# Patient Record
Sex: Female | Born: 1955 | Race: White | Hispanic: No | Marital: Single | Smoking: Former smoker
Health system: Southern US, Community
[De-identification: ages and names within clinical notes are randomized; demographics above are authoritative.]

---

## 2004-11-28 ENCOUNTER — Ambulatory Visit: Payer: Self-pay | Admitting: *Deleted

## 2004-12-19 ENCOUNTER — Ambulatory Visit: Payer: Self-pay | Admitting: *Deleted

## 2005-03-13 ENCOUNTER — Other Ambulatory Visit: Payer: Self-pay

## 2005-03-13 ENCOUNTER — Inpatient Hospital Stay: Payer: Self-pay | Admitting: Psychiatry

## 2005-04-02 ENCOUNTER — Other Ambulatory Visit: Payer: Self-pay

## 2005-04-28 ENCOUNTER — Emergency Department: Payer: Self-pay | Admitting: Emergency Medicine

## 2005-06-06 ENCOUNTER — Emergency Department: Payer: Self-pay | Admitting: Emergency Medicine

## 2005-12-25 ENCOUNTER — Ambulatory Visit: Payer: Self-pay | Admitting: *Deleted

## 2006-01-20 ENCOUNTER — Emergency Department: Payer: Self-pay | Admitting: Emergency Medicine

## 2006-01-21 ENCOUNTER — Inpatient Hospital Stay: Payer: Self-pay | Admitting: Unknown Physician Specialty

## 2007-03-05 ENCOUNTER — Emergency Department: Payer: Self-pay | Admitting: Emergency Medicine

## 2007-03-05 ENCOUNTER — Other Ambulatory Visit: Payer: Self-pay

## 2007-03-06 ENCOUNTER — Ambulatory Visit: Payer: Self-pay | Admitting: *Deleted

## 2007-03-13 ENCOUNTER — Ambulatory Visit: Payer: Self-pay | Admitting: Emergency Medicine

## 2007-07-07 ENCOUNTER — Emergency Department: Payer: Self-pay | Admitting: Emergency Medicine

## 2007-07-07 ENCOUNTER — Other Ambulatory Visit: Payer: Self-pay

## 2007-11-12 ENCOUNTER — Inpatient Hospital Stay: Payer: Self-pay | Admitting: Psychiatry

## 2008-07-18 ENCOUNTER — Emergency Department: Payer: Self-pay | Admitting: Emergency Medicine

## 2008-09-11 ENCOUNTER — Inpatient Hospital Stay: Payer: Self-pay | Admitting: Psychiatry

## 2009-03-28 ENCOUNTER — Encounter: Admission: RE | Admit: 2009-03-28 | Discharge: 2009-03-28 | Payer: Self-pay | Admitting: Neurology

## 2009-04-08 ENCOUNTER — Ambulatory Visit: Payer: Self-pay | Admitting: Family Medicine

## 2009-06-06 ENCOUNTER — Ambulatory Visit: Payer: Self-pay | Admitting: Psychology

## 2009-07-12 ENCOUNTER — Ambulatory Visit: Payer: Self-pay | Admitting: Internal Medicine

## 2010-04-03 ENCOUNTER — Emergency Department: Payer: Self-pay | Admitting: Emergency Medicine

## 2010-05-04 ENCOUNTER — Inpatient Hospital Stay: Payer: Self-pay | Admitting: Psychiatry

## 2010-05-26 DIAGNOSIS — F200 Paranoid schizophrenia: Secondary | ICD-10-CM

## 2010-06-20 ENCOUNTER — Ambulatory Visit: Payer: Self-pay | Admitting: Internal Medicine

## 2010-07-06 ENCOUNTER — Inpatient Hospital Stay: Payer: Self-pay | Admitting: Psychiatry

## 2010-07-17 ENCOUNTER — Ambulatory Visit: Payer: Self-pay | Admitting: Internal Medicine

## 2010-08-19 ENCOUNTER — Inpatient Hospital Stay: Payer: Self-pay | Admitting: Internal Medicine

## 2011-02-07 ENCOUNTER — Observation Stay: Payer: Self-pay | Admitting: Internal Medicine

## 2011-02-07 LAB — URINALYSIS, COMPLETE
Bacteria: NONE SEEN
Bilirubin,UR: NEGATIVE
Blood: NEGATIVE
Glucose,UR: NEGATIVE mg/dL (ref 0–75)
Ketone: NEGATIVE
Ph: 6 (ref 4.5–8.0)
Protein: NEGATIVE
RBC,UR: <1 /HPF (ref 0–5)

## 2011-02-07 LAB — COMPREHENSIVE METABOLIC PANEL
Albumin: 3.6 g/dL (ref 3.4–5.0)
Alkaline Phosphatase: 117 U/L (ref 50–136)
Anion Gap: 10 (ref 7–16)
BUN: 9 mg/dL (ref 7–18)
Bilirubin,Total: 0.2 mg/dL (ref 0.2–1.0)
Calcium, Total: 9.3 mg/dL (ref 8.5–10.1)
Creatinine: 0.84 mg/dL (ref 0.60–1.30)
Glucose: 124 mg/dL — ABNORMAL HIGH (ref 65–99)
Osmolality: 266 (ref 275–301)
SGOT(AST): 13 U/L — ABNORMAL LOW (ref 15–37)

## 2011-02-07 LAB — DRUG SCREEN, URINE
Amphetamines, Ur Screen: NEGATIVE (ref ?–1000)
Benzodiazepine, Ur Scrn: NEGATIVE (ref ?–200)
MDMA (Ecstasy)Ur Screen: NEGATIVE (ref ?–500)
Methadone, Ur Screen: NEGATIVE (ref ?–300)
Phencyclidine (PCP) Ur S: NEGATIVE (ref ?–25)
Tricyclic, Ur Screen: NEGATIVE (ref ?–1000)

## 2011-02-07 LAB — AMMONIA: Ammonia, Plasma: <25 mcmol/L (ref 11–32)

## 2011-02-07 LAB — CBC
HGB: 10.3 g/dL — ABNORMAL LOW (ref 12.0–16.0)
MCH: 32.5 pg (ref 26.0–34.0)
MCHC: 34.1 g/dL (ref 32.0–36.0)
MCV: 95 fL (ref 80–100)
Platelet: 241 10*3/uL (ref 150–440)
RBC: 3.17 10*6/uL — ABNORMAL LOW (ref 3.80–5.20)
RDW: 13.9 % (ref 11.5–14.5)

## 2011-02-07 LAB — ETHANOL
Ethanol %: <0.003 % (ref 0.000–0.080)
Ethanol: <3 mg/dL

## 2011-02-08 LAB — BASIC METABOLIC PANEL
Anion Gap: 9 (ref 7–16)
BUN: 6 mg/dL — ABNORMAL LOW (ref 7–18)
Calcium, Total: 8.7 mg/dL (ref 8.5–10.1)
Chloride: 102 mmol/L (ref 98–107)
Co2: 26 mmol/L (ref 21–32)
Creatinine: 0.76 mg/dL (ref 0.60–1.30)
EGFR (Non-African Amer.): >60
Glucose: 131 mg/dL — ABNORMAL HIGH (ref 65–99)
Potassium: 3.9 mmol/L (ref 3.5–5.1)
Sodium: 137 mmol/L (ref 136–145)

## 2011-02-15 ENCOUNTER — Ambulatory Visit: Payer: Self-pay | Admitting: Cardiothoracic Surgery

## 2011-02-20 ENCOUNTER — Ambulatory Visit: Payer: Self-pay | Admitting: Cardiothoracic Surgery

## 2011-02-21 LAB — CREATININE, SERUM: Creatinine: 0.76 mg/dL (ref 0.60–1.30)

## 2011-02-22 LAB — CBC CANCER CENTER
Basophil #: 0 x10 3/mm (ref 0.0–0.1)
Eosinophil #: 0.6 x10 3/mm (ref 0.0–0.7)
MCHC: 34.3 g/dL (ref 32.0–36.0)
MCV: 95 fL (ref 80–100)
Monocyte %: 9.3 %
Neutrophil #: 5.7 x10 3/mm (ref 1.4–6.5)
Neutrophil %: 73.7 %
Platelet: 384 x10 3/mm (ref 150–440)
RDW: 13.9 % (ref 11.5–14.5)
WBC: 7.8 x10 3/mm (ref 3.6–11.0)

## 2011-02-22 LAB — COMPREHENSIVE METABOLIC PANEL
Albumin: 3.8 g/dL (ref 3.4–5.0)
Bilirubin,Total: 0.2 mg/dL (ref 0.2–1.0)
Calcium, Total: 9.8 mg/dL (ref 8.5–10.1)
Chloride: 97 mmol/L — ABNORMAL LOW (ref 98–107)
Creatinine: 0.98 mg/dL (ref 0.60–1.30)
Glucose: 101 mg/dL — ABNORMAL HIGH (ref 65–99)
Osmolality: 267 (ref 275–301)
Potassium: 4.2 mmol/L (ref 3.5–5.1)
Sodium: 133 mmol/L — ABNORMAL LOW (ref 136–145)
Total Protein: 7.1 g/dL (ref 6.4–8.2)

## 2011-02-22 LAB — PROTIME-INR
INR: 0.9
Prothrombin Time: 12 secs (ref 11.5–14.7)

## 2011-02-26 ENCOUNTER — Ambulatory Visit: Payer: Self-pay | Admitting: Oncology

## 2011-02-26 LAB — APTT: Activated PTT: 34.3 secs (ref 23.6–35.9)

## 2011-02-28 LAB — PATHOLOGY REPORT

## 2011-03-09 ENCOUNTER — Ambulatory Visit: Payer: Self-pay | Admitting: Cardiothoracic Surgery

## 2011-03-12 LAB — CBC CANCER CENTER
Basophil #: 0.1 x10 3/mm (ref 0.0–0.1)
Eosinophil #: 0.6 x10 3/mm (ref 0.0–0.7)
Eosinophil %: 6.3 %
HCT: 31.5 % — ABNORMAL LOW (ref 35.0–47.0)
Lymphocyte #: 0.6 x10 3/mm — ABNORMAL LOW (ref 1.0–3.6)
MCH: 32.3 pg (ref 26.0–34.0)
MCV: 93 fL (ref 80–100)
Monocyte %: 7.9 %
Neutrophil %: 78 %
Platelet: 427 x10 3/mm (ref 150–440)
RDW: 13.4 % (ref 11.5–14.5)
WBC: 9.2 x10 3/mm (ref 3.6–11.0)

## 2011-03-12 LAB — BASIC METABOLIC PANEL
Anion Gap: 8 (ref 7–16)
Chloride: 99 mmol/L (ref 98–107)
Co2: 31 mmol/L (ref 21–32)
Creatinine: 0.83 mg/dL (ref 0.60–1.30)
Osmolality: 284 (ref 275–301)

## 2011-03-19 ENCOUNTER — Ambulatory Visit: Payer: Self-pay | Admitting: Emergency Medicine

## 2011-03-19 LAB — CBC CANCER CENTER
Eosinophil %: 2.5 %
HCT: 29.9 % — ABNORMAL LOW (ref 35.0–47.0)
Lymphocyte #: 0.3 x10 3/mm — ABNORMAL LOW (ref 1.0–3.6)
MCH: 32.1 pg (ref 26.0–34.0)
MCV: 93 fL (ref 80–100)
Monocyte #: 0.1 x10 3/mm (ref 0.0–0.7)
Monocyte %: 1.1 %
Neutrophil %: 90.7 %
Platelet: 249 x10 3/mm (ref 150–440)
RBC: 3.22 10*6/uL — ABNORMAL LOW (ref 3.80–5.20)
RDW: 13.3 % (ref 11.5–14.5)

## 2011-03-20 ENCOUNTER — Ambulatory Visit: Payer: Self-pay | Admitting: Emergency Medicine

## 2011-03-20 LAB — PROTIME-INR
INR: 0.8
Prothrombin Time: 11.7 secs (ref 11.5–14.7)

## 2011-03-22 ENCOUNTER — Ambulatory Visit: Payer: Self-pay | Admitting: Emergency Medicine

## 2011-04-02 LAB — CBC CANCER CENTER
Basophil %: 0.1 %
Eosinophil #: 0 x10 3/mm (ref 0.0–0.7)
HCT: 30.8 % — ABNORMAL LOW (ref 35.0–47.0)
MCH: 32 pg (ref 26.0–34.0)
MCHC: 34.1 g/dL (ref 32.0–36.0)
MCV: 94 fL (ref 80–100)
Monocyte #: 0.7 x10 3/mm (ref 0.0–0.7)
Monocyte %: 7 %
Neutrophil %: 89.5 %
Platelet: 498 x10 3/mm — ABNORMAL HIGH (ref 150–440)
RDW: 13.6 % (ref 11.5–14.5)
WBC: 9.9 x10 3/mm (ref 3.6–11.0)

## 2011-04-06 ENCOUNTER — Ambulatory Visit: Payer: Self-pay | Admitting: Cardiothoracic Surgery

## 2011-04-09 LAB — CBC CANCER CENTER
Basophil #: 0.1 x10 3/mm (ref 0.0–0.1)
Basophil %: 1 %
Eosinophil #: 0 x10 3/mm (ref 0.0–0.7)
HCT: 28.1 % — ABNORMAL LOW (ref 35.0–47.0)
HGB: 9.6 g/dL — ABNORMAL LOW (ref 12.0–16.0)
Lymphocyte #: 0.3 x10 3/mm — ABNORMAL LOW (ref 1.0–3.6)
Lymphocyte %: 2.4 %
MCH: 32.6 pg (ref 26.0–34.0)
MCHC: 34.3 g/dL (ref 32.0–36.0)
MCV: 95 fL (ref 80–100)
Monocyte #: 0.5 x10 3/mm (ref 0.0–0.7)
Monocyte %: 5 %
Neutrophil #: 9.6 x10 3/mm — ABNORMAL HIGH (ref 1.4–6.5)
RDW: 16.1 % — ABNORMAL HIGH (ref 11.5–14.5)

## 2011-04-09 LAB — COMPREHENSIVE METABOLIC PANEL
Albumin: 3.2 g/dL — ABNORMAL LOW (ref 3.4–5.0)
Alkaline Phosphatase: 122 U/L (ref 50–136)
Anion Gap: 13 (ref 7–16)
Bilirubin,Total: 0.2 mg/dL (ref 0.2–1.0)
Calcium, Total: 9.6 mg/dL (ref 8.5–10.1)
Co2: 25 mmol/L (ref 21–32)
Creatinine: 0.97 mg/dL (ref 0.60–1.30)
EGFR (African American): >60
Osmolality: 290 (ref 275–301)

## 2011-04-11 LAB — GLUCOSE, RANDOM: Glucose: 355 mg/dL — ABNORMAL HIGH (ref 65–99)

## 2011-04-16 LAB — CBC CANCER CENTER
Basophil #: 0 x10 3/mm (ref 0.0–0.1)
Basophil %: 0.5 %
Eosinophil #: 0 x10 3/mm (ref 0.0–0.7)
Eosinophil %: 0.5 %
HCT: 26.4 % — ABNORMAL LOW (ref 35.0–47.0)
HGB: 9.2 g/dL — ABNORMAL LOW (ref 12.0–16.0)
Lymphocyte #: 0.3 x10 3/mm — ABNORMAL LOW (ref 1.0–3.6)
Lymphocyte %: 5.7 %
MCH: 32.9 pg (ref 26.0–34.0)
MCHC: 34.7 g/dL (ref 32.0–36.0)
MCV: 95 fL (ref 80–100)
Monocyte #: 0 x10 3/mm (ref 0.0–0.7)
Monocyte %: 0.6 %
Neutrophil #: 5 x10 3/mm (ref 1.4–6.5)
Neutrophil %: 92.7 %
Platelet: 215 x10 3/mm (ref 150–440)
RBC: 2.78 10*6/uL — ABNORMAL LOW (ref 3.80–5.20)
RDW: 16.8 % — ABNORMAL HIGH (ref 11.5–14.5)
WBC: 5.4 x10 3/mm (ref 3.6–11.0)

## 2011-04-23 LAB — CBC CANCER CENTER
Basophil %: 0.4 %
Eosinophil %: 5.8 %
HCT: 24.1 % — ABNORMAL LOW (ref 35.0–47.0)
HGB: 8.4 g/dL — ABNORMAL LOW (ref 12.0–16.0)
Lymphocyte #: 0.4 x10 3/mm — ABNORMAL LOW (ref 1.0–3.6)
MCH: 33.1 pg (ref 26.0–34.0)
Monocyte #: 0.4 x10 3/mm (ref 0.0–0.7)
Monocyte %: 40 %
Neutrophil #: 0.2 x10 3/mm — ABNORMAL LOW (ref 1.4–6.5)
Neutrophil %: 18.7 %
RBC: 2.55 10*6/uL — ABNORMAL LOW (ref 3.80–5.20)
WBC: 1.1 x10 3/mm — CL (ref 3.6–11.0)

## 2011-04-30 LAB — CBC CANCER CENTER
Basophil %: 0 %
Eosinophil #: 0.1 x10 3/mm (ref 0.0–0.7)
Eosinophil %: 0.7 %
HCT: 31.1 % — ABNORMAL LOW (ref 35.0–47.0)
Lymphocyte #: 0.7 x10 3/mm — ABNORMAL LOW (ref 1.0–3.6)
Monocyte #: 1 x10 3/mm — ABNORMAL HIGH (ref 0.0–0.7)
Monocyte %: 8.7 %
Neutrophil %: 84.7 %
Platelet: 416 x10 3/mm (ref 150–440)
WBC: 11 x10 3/mm (ref 3.6–11.0)

## 2011-05-07 ENCOUNTER — Ambulatory Visit: Payer: Self-pay | Admitting: Cardiothoracic Surgery

## 2011-05-07 LAB — COMPREHENSIVE METABOLIC PANEL
Alkaline Phosphatase: 95 U/L (ref 50–136)
Anion Gap: 12 (ref 7–16)
BUN: 15 mg/dL (ref 7–18)
Bilirubin,Total: 0.2 mg/dL (ref 0.2–1.0)
EGFR (African American): >60
Potassium: 4.2 mmol/L (ref 3.5–5.1)
SGOT(AST): 16 U/L (ref 15–37)

## 2011-05-07 LAB — CBC CANCER CENTER
Basophil %: 0.1 %
Eosinophil #: 0.1 x10 3/mm (ref 0.0–0.7)
Eosinophil %: 0.6 %
Lymphocyte %: 7.3 %
MCH: 31.8 pg (ref 26.0–34.0)
MCHC: 33.8 g/dL (ref 32.0–36.0)
MCV: 94 fL (ref 80–100)
Monocyte #: 1 x10 3/mm — ABNORMAL HIGH (ref 0.0–0.7)
Neutrophil #: 8.1 x10 3/mm — ABNORMAL HIGH (ref 1.4–6.5)
Neutrophil %: 82.1 %
Platelet: 388 x10 3/mm (ref 150–440)
RBC: 3.43 10*6/uL — ABNORMAL LOW (ref 3.80–5.20)
RDW: 21.4 % — ABNORMAL HIGH (ref 11.5–14.5)
WBC: 9.8 x10 3/mm (ref 3.6–11.0)

## 2011-05-14 LAB — CBC CANCER CENTER
Basophil #: 0 x10 3/mm (ref 0.0–0.1)
Basophil %: 0.6 %
Eosinophil #: 0 x10 3/mm (ref 0.0–0.7)
Eosinophil %: 0.4 %
HCT: 25.2 % — ABNORMAL LOW (ref 35.0–47.0)
HGB: 8.9 g/dL — ABNORMAL LOW (ref 12.0–16.0)
Lymphocyte %: 8.6 %
MCH: 32.8 pg (ref 26.0–34.0)
MCHC: 35.2 g/dL (ref 32.0–36.0)
MCV: 93 fL (ref 80–100)
Monocyte #: 0.1 x10 3/mm (ref 0.0–0.7)
Neutrophil #: 3.6 x10 3/mm (ref 1.4–6.5)
Neutrophil %: 86.9 %
Platelet: 246 x10 3/mm (ref 150–440)
RBC: 2.7 10*6/uL — ABNORMAL LOW (ref 3.80–5.20)
RDW: 20.6 % — ABNORMAL HIGH (ref 11.5–14.5)

## 2011-05-21 LAB — CBC CANCER CENTER
Eosinophil #: 0 x10 3/mm (ref 0.0–0.7)
Eosinophil %: 1.9 %
HCT: 26.3 % — ABNORMAL LOW (ref 35.0–47.0)
HGB: 8.8 g/dL — ABNORMAL LOW (ref 12.0–16.0)
Lymphocyte #: 0.3 x10 3/mm — ABNORMAL LOW (ref 1.0–3.6)
Lymphocyte %: 12.4 %
MCV: 97 fL (ref 80–100)
Monocyte #: 0.3 x10 3/mm (ref 0.2–0.9)
Monocyte %: 16.1 %
Neutrophil #: 1.4 x10 3/mm (ref 1.4–6.5)
Platelet: 190 x10 3/mm (ref 150–440)
RBC: 2.72 10*6/uL — ABNORMAL LOW (ref 3.80–5.20)
WBC: 2.1 x10 3/mm — ABNORMAL LOW (ref 3.6–11.0)

## 2011-05-24 ENCOUNTER — Ambulatory Visit: Payer: Self-pay | Admitting: Emergency Medicine

## 2011-05-24 LAB — CBC WITH DIFFERENTIAL/PLATELET
Basophil %: 1 %
Eosinophil #: 0 10*3/uL (ref 0.0–0.7)
Eosinophil %: 1.7 %
HGB: 9.7 g/dL — ABNORMAL LOW (ref 12.0–16.0)
Lymphocyte #: 0.3 10*3/uL — ABNORMAL LOW (ref 1.0–3.6)
MCH: 32.5 pg (ref 26.0–34.0)
Monocyte #: 0.7 x10 3/mm (ref 0.2–0.9)
Monocyte %: 36.4 %
Neutrophil #: 0.9 10*3/uL — ABNORMAL LOW (ref 1.4–6.5)
Neutrophil %: 47.3 %
Platelet: 287 10*3/uL (ref 150–440)
RDW: 21.2 % — ABNORMAL HIGH (ref 11.5–14.5)
WBC: 1.9 10*3/uL — CL (ref 3.6–11.0)

## 2011-05-24 LAB — COMPREHENSIVE METABOLIC PANEL
Albumin: 3.5 g/dL (ref 3.4–5.0)
Alkaline Phosphatase: 88 U/L (ref 50–136)
Anion Gap: 11 (ref 7–16)
Bilirubin,Total: 0.3 mg/dL (ref 0.2–1.0)
Co2: 26 mmol/L (ref 21–32)
EGFR (African American): >60
Osmolality: 273 (ref 275–301)
SGPT (ALT): 11 U/L — ABNORMAL LOW
Sodium: 134 mmol/L — ABNORMAL LOW (ref 136–145)
Total Protein: 6.7 g/dL (ref 6.4–8.2)

## 2011-05-28 LAB — CBC CANCER CENTER
Basophil #: 0 x10 3/mm (ref 0.0–0.1)
Basophil %: 0.6 %
Eosinophil %: 1.9 %
Lymphocyte #: 0.4 x10 3/mm — ABNORMAL LOW (ref 1.0–3.6)
Lymphocyte %: 8.6 %
MCHC: 33.3 g/dL (ref 32.0–36.0)
Monocyte %: 17.4 %
Neutrophil #: 3.6 x10 3/mm (ref 1.4–6.5)
Platelet: 285 x10 3/mm (ref 150–440)
RBC: 2.9 10*6/uL — ABNORMAL LOW (ref 3.80–5.20)
WBC: 5.1 x10 3/mm (ref 3.6–11.0)

## 2011-06-04 LAB — CBC CANCER CENTER
Basophil #: 0 x10 3/mm (ref 0.0–0.1)
Basophil %: 0.8 %
HCT: 27.7 % — ABNORMAL LOW (ref 35.0–47.0)
Lymphocyte %: 7.4 %
Monocyte #: 0.8 x10 3/mm (ref 0.2–0.9)
Monocyte %: 12.1 %
Neutrophil #: 4.9 x10 3/mm (ref 1.4–6.5)
RBC: 2.82 10*6/uL — ABNORMAL LOW (ref 3.80–5.20)
RDW: 20.8 % — ABNORMAL HIGH (ref 11.5–14.5)
WBC: 6.3 x10 3/mm (ref 3.6–11.0)

## 2011-06-06 ENCOUNTER — Ambulatory Visit: Payer: Self-pay | Admitting: Cardiothoracic Surgery

## 2011-06-11 LAB — COMPREHENSIVE METABOLIC PANEL
Alkaline Phosphatase: 75 U/L (ref 50–136)
Anion Gap: 8 (ref 7–16)
BUN: 13 mg/dL (ref 7–18)
Bilirubin,Total: 0.2 mg/dL (ref 0.2–1.0)
Chloride: 101 mmol/L (ref 98–107)
Co2: 28 mmol/L (ref 21–32)
EGFR (African American): >60
EGFR (Non-African Amer.): >60
Glucose: 167 mg/dL — ABNORMAL HIGH (ref 65–99)
Osmolality: 278 (ref 275–301)
Potassium: 4 mmol/L (ref 3.5–5.1)
SGOT(AST): 10 U/L — ABNORMAL LOW (ref 15–37)
SGPT (ALT): 9 U/L — ABNORMAL LOW
Sodium: 137 mmol/L (ref 136–145)
Total Protein: 6.2 g/dL — ABNORMAL LOW (ref 6.4–8.2)

## 2011-06-11 LAB — CBC CANCER CENTER
Basophil #: 0 x10 3/mm (ref 0.0–0.1)
Basophil %: 0.7 %
Eosinophil #: 0.1 x10 3/mm (ref 0.0–0.7)
Eosinophil %: 0.9 %
HGB: 8.9 g/dL — ABNORMAL LOW (ref 12.0–16.0)
Lymphocyte %: 6.3 %
MCH: 32.8 pg (ref 26.0–34.0)
MCHC: 33 g/dL (ref 32.0–36.0)
MCV: 100 fL (ref 80–100)
Monocyte %: 12 %
Platelet: 266 x10 3/mm (ref 150–440)
RDW: 20 % — ABNORMAL HIGH (ref 11.5–14.5)
WBC: 6.5 x10 3/mm (ref 3.6–11.0)

## 2011-06-12 LAB — GLUCOSE, RANDOM: Glucose: 145 mg/dL — ABNORMAL HIGH (ref 65–99)

## 2011-06-13 LAB — GLUCOSE, RANDOM: Glucose: 139 mg/dL — ABNORMAL HIGH (ref 65–99)

## 2011-06-18 LAB — CBC CANCER CENTER
Basophil #: 0.1 x10 3/mm (ref 0.0–0.1)
Basophil %: 1.3 %
Eosinophil #: 0.1 x10 3/mm (ref 0.0–0.7)
Lymphocyte #: 0.4 x10 3/mm — ABNORMAL LOW (ref 1.0–3.6)
Lymphocyte %: 7.5 %
MCHC: 32.9 g/dL (ref 32.0–36.0)
MCV: 100 fL (ref 80–100)
Monocyte %: 1.9 %
Neutrophil #: 4.3 x10 3/mm (ref 1.4–6.5)
Neutrophil %: 87.8 %
Platelet: 204 x10 3/mm (ref 150–440)
WBC: 4.8 x10 3/mm (ref 3.6–11.0)

## 2011-06-25 LAB — CBC CANCER CENTER
Basophil #: 0 x10 3/mm (ref 0.0–0.1)
Eosinophil %: 3.7 %
HGB: 8.4 g/dL — ABNORMAL LOW (ref 12.0–16.0)
Lymphocyte %: 14.6 %
MCHC: 33.4 g/dL (ref 32.0–36.0)
MCV: 100 fL (ref 80–100)
Monocyte #: 0.3 x10 3/mm (ref 0.2–0.9)
Monocyte %: 14.1 %
Neutrophil #: 1.3 x10 3/mm — ABNORMAL LOW (ref 1.4–6.5)
Neutrophil %: 66.7 %
RBC: 2.53 10*6/uL — ABNORMAL LOW (ref 3.80–5.20)
RDW: 17.1 % — ABNORMAL HIGH (ref 11.5–14.5)
WBC: 1.9 x10 3/mm — CL (ref 3.6–11.0)

## 2011-07-03 LAB — CBC CANCER CENTER
Basophil #: 0 x10 3/mm (ref 0.0–0.1)
Eosinophil %: 1 %
HGB: 8.5 g/dL — ABNORMAL LOW (ref 12.0–16.0)
Lymphocyte #: 0.4 x10 3/mm — ABNORMAL LOW (ref 1.0–3.6)
Lymphocyte %: 6.4 %
MCH: 33.9 pg (ref 26.0–34.0)
MCHC: 33.4 g/dL (ref 32.0–36.0)
MCV: 101 fL — ABNORMAL HIGH (ref 80–100)
Monocyte #: 1.1 x10 3/mm — ABNORMAL HIGH (ref 0.2–0.9)
Monocyte %: 18 %
Neutrophil #: 4.3 x10 3/mm (ref 1.4–6.5)
Neutrophil %: 74.1 %
RBC: 2.51 10*6/uL — ABNORMAL LOW (ref 3.80–5.20)
RDW: 18.6 % — ABNORMAL HIGH (ref 11.5–14.5)
WBC: 5.8 x10 3/mm (ref 3.6–11.0)

## 2011-07-07 ENCOUNTER — Ambulatory Visit: Payer: Self-pay | Admitting: Cardiothoracic Surgery

## 2011-07-09 LAB — CBC CANCER CENTER
Basophil %: 0.6 %
Eosinophil %: 0.7 %
Lymphocyte #: 0.4 x10 3/mm — ABNORMAL LOW (ref 1.0–3.6)
MCH: 34 pg (ref 26.0–34.0)
MCHC: 33.6 g/dL (ref 32.0–36.0)
MCV: 101 fL — ABNORMAL HIGH (ref 80–100)
Monocyte #: 1.2 x10 3/mm — ABNORMAL HIGH (ref 0.2–0.9)
Neutrophil %: 79.2 %
Platelet: 262 x10 3/mm (ref 150–440)
RBC: 2.67 10*6/uL — ABNORMAL LOW (ref 3.80–5.20)

## 2011-07-09 LAB — BASIC METABOLIC PANEL
Anion Gap: 11 (ref 7–16)
BUN: 15 mg/dL (ref 7–18)
Creatinine: 0.88 mg/dL (ref 0.60–1.30)
EGFR (Non-African Amer.): >60
Potassium: 4.3 mmol/L (ref 3.5–5.1)

## 2011-07-10 LAB — GLUCOSE, RANDOM: Glucose: 192 mg/dL — ABNORMAL HIGH (ref 65–99)

## 2011-07-11 LAB — GLUCOSE, RANDOM: Glucose: 139 mg/dL — ABNORMAL HIGH (ref 65–99)

## 2011-07-16 LAB — CBC CANCER CENTER
Basophil #: 0 x10 3/mm (ref 0.0–0.1)
Basophil %: 0.8 %
Eosinophil %: 0.6 %
Lymphocyte #: 0.3 x10 3/mm — ABNORMAL LOW (ref 1.0–3.6)
Lymphocyte %: 5.6 %
MCH: 34 pg (ref 26.0–34.0)
MCHC: 33.7 g/dL (ref 32.0–36.0)
MCV: 101 fL — ABNORMAL HIGH (ref 80–100)
Monocyte #: 0.2 x10 3/mm (ref 0.2–0.9)
WBC: 5.8 x10 3/mm (ref 3.6–11.0)

## 2011-07-23 LAB — CBC CANCER CENTER
Basophil %: 0.1 %
Eosinophil %: 0.3 %
HCT: 25 % — ABNORMAL LOW (ref 35.0–47.0)
HGB: 8.3 g/dL — ABNORMAL LOW (ref 12.0–16.0)
Lymphocyte %: 3.6 %
MCH: 33.7 pg (ref 26.0–34.0)
Monocyte #: 1.3 x10 3/mm — ABNORMAL HIGH (ref 0.2–0.9)
Monocyte %: 9.5 %
Neutrophil #: 11.9 x10 3/mm — ABNORMAL HIGH (ref 1.4–6.5)
Platelet: 133 x10 3/mm — ABNORMAL LOW (ref 150–440)
WBC: 13.8 x10 3/mm — ABNORMAL HIGH (ref 3.6–11.0)

## 2011-07-30 LAB — CBC CANCER CENTER
Basophil #: 0 x10 3/mm (ref 0.0–0.1)
Basophil %: 0.3 %
Eosinophil #: 0.1 x10 3/mm (ref 0.0–0.7)
Eosinophil %: 0.4 %
Lymphocyte %: 3.3 %
MCH: 33.6 pg (ref 26.0–34.0)
MCHC: 32.7 g/dL (ref 32.0–36.0)
MCV: 103 fL — ABNORMAL HIGH (ref 80–100)
Monocyte #: 1.4 x10 3/mm — ABNORMAL HIGH (ref 0.2–0.9)
Monocyte %: 10.3 %
Neutrophil %: 85.7 %
RDW: 17.4 % — ABNORMAL HIGH (ref 11.5–14.5)
WBC: 13.9 x10 3/mm — ABNORMAL HIGH (ref 3.6–11.0)

## 2011-08-06 ENCOUNTER — Ambulatory Visit: Payer: Self-pay | Admitting: Cardiothoracic Surgery

## 2011-08-06 LAB — CBC CANCER CENTER
Basophil %: 0.5 %
Eosinophil %: 0.6 %
HCT: 27.2 % — ABNORMAL LOW (ref 35.0–47.0)
HGB: 8.9 g/dL — ABNORMAL LOW (ref 12.0–16.0)
Lymphocyte #: 0.4 x10 3/mm — ABNORMAL LOW (ref 1.0–3.6)
Lymphocyte %: 3.3 %
MCH: 34.5 pg — ABNORMAL HIGH (ref 26.0–34.0)
MCHC: 32.9 g/dL (ref 32.0–36.0)
Monocyte #: 1.3 x10 3/mm — ABNORMAL HIGH (ref 0.2–0.9)
Neutrophil #: 8.9 x10 3/mm — ABNORMAL HIGH (ref 1.4–6.5)
Neutrophil %: 83.6 %
Platelet: 319 x10 3/mm (ref 150–440)
RDW: 18.2 % — ABNORMAL HIGH (ref 11.5–14.5)
WBC: 10.7 x10 3/mm (ref 3.6–11.0)

## 2011-08-06 LAB — COMPREHENSIVE METABOLIC PANEL
Albumin: 3.1 g/dL — ABNORMAL LOW (ref 3.4–5.0)
Alkaline Phosphatase: 126 U/L (ref 50–136)
Anion Gap: 10 (ref 7–16)
BUN: 12 mg/dL (ref 7–18)
Calcium, Total: 9.4 mg/dL (ref 8.5–10.1)
EGFR (African American): >60
EGFR (Non-African Amer.): >60
Glucose: 175 mg/dL — ABNORMAL HIGH (ref 65–99)
SGOT(AST): 9 U/L — ABNORMAL LOW (ref 15–37)
SGPT (ALT): 12 U/L
Sodium: 140 mmol/L (ref 136–145)

## 2011-08-07 LAB — GLUCOSE, RANDOM: Glucose: 212 mg/dL — ABNORMAL HIGH (ref 65–99)

## 2011-08-08 LAB — COMPREHENSIVE METABOLIC PANEL
Albumin: 3.1 g/dL — ABNORMAL LOW (ref 3.4–5.0)
Alkaline Phosphatase: 106 U/L (ref 50–136)
Anion Gap: 8 (ref 7–16)
BUN: 25 mg/dL — ABNORMAL HIGH (ref 7–18)
Bilirubin,Total: 0.2 mg/dL (ref 0.2–1.0)
Chloride: 101 mmol/L (ref 98–107)
Creatinine: 0.81 mg/dL (ref 0.60–1.30)
EGFR (African American): >60
EGFR (Non-African Amer.): >60
Glucose: 136 mg/dL — ABNORMAL HIGH (ref 65–99)
Potassium: 3.9 mmol/L (ref 3.5–5.1)
SGOT(AST): 11 U/L — ABNORMAL LOW (ref 15–37)
SGPT (ALT): 15 U/L
Total Protein: 6.2 g/dL — ABNORMAL LOW (ref 6.4–8.2)

## 2011-08-08 LAB — CBC CANCER CENTER
Basophil #: 0.5 x10 3/mm — ABNORMAL HIGH (ref 0.0–0.1)
Eosinophil #: 0 x10 3/mm (ref 0.0–0.7)
Lymphocyte #: 0.3 x10 3/mm — ABNORMAL LOW (ref 1.0–3.6)
Lymphocyte %: 1.3 %
MCH: 33.9 pg (ref 26.0–34.0)
MCHC: 32.4 g/dL (ref 32.0–36.0)
Monocyte #: 1 x10 3/mm — ABNORMAL HIGH (ref 0.2–0.9)
Neutrophil #: 18.9 x10 3/mm — ABNORMAL HIGH (ref 1.4–6.5)
Neutrophil %: 91.2 %
Platelet: 304 x10 3/mm (ref 150–440)
RBC: 2.47 10*6/uL — ABNORMAL LOW (ref 3.80–5.20)
RDW: 18.1 % — ABNORMAL HIGH (ref 11.5–14.5)
WBC: 20.7 x10 3/mm — ABNORMAL HIGH (ref 3.6–11.0)

## 2011-08-13 LAB — CBC CANCER CENTER
Basophil #: 0 x10 3/mm (ref 0.0–0.1)
Basophil %: 0.8 %
Eosinophil #: 0 x10 3/mm (ref 0.0–0.7)
Eosinophil %: 0.5 %
HCT: 23.6 % — ABNORMAL LOW (ref 35.0–47.0)
HGB: 7.9 g/dL — ABNORMAL LOW (ref 12.0–16.0)
Lymphocyte #: 0.3 x10 3/mm — ABNORMAL LOW (ref 1.0–3.6)
Lymphocyte %: 4.9 %
MCH: 34.9 pg — ABNORMAL HIGH (ref 26.0–34.0)
MCHC: 33.7 g/dL (ref 32.0–36.0)
MCV: 103 fL — ABNORMAL HIGH (ref 80–100)
Monocyte #: 0.1 x10 3/mm — ABNORMAL LOW (ref 0.2–0.9)
Neutrophil %: 92.1 %
Platelet: 165 x10 3/mm (ref 150–440)
WBC: 5.3 x10 3/mm (ref 3.6–11.0)

## 2011-08-20 LAB — CBC CANCER CENTER
Basophil %: 0.8 %
Eosinophil #: 0.1 x10 3/mm (ref 0.0–0.7)
Eosinophil %: 2.7 %
HGB: 7.3 g/dL — ABNORMAL LOW (ref 12.0–16.0)
Lymphocyte #: 0.3 x10 3/mm — ABNORMAL LOW (ref 1.0–3.6)
MCH: 34.4 pg — ABNORMAL HIGH (ref 26.0–34.0)
MCV: 104 fL — ABNORMAL HIGH (ref 80–100)
Monocyte #: 0.3 x10 3/mm (ref 0.2–0.9)
Neutrophil #: 1.6 x10 3/mm (ref 1.4–6.5)
RBC: 2.13 10*6/uL — ABNORMAL LOW (ref 3.80–5.20)

## 2011-08-27 LAB — CBC CANCER CENTER
Eosinophil #: 0.1 x10 3/mm (ref 0.0–0.7)
Eosinophil %: 2.3 %
HGB: 8.2 g/dL — ABNORMAL LOW (ref 12.0–16.0)
Lymphocyte %: 7.1 %
MCH: 34.1 pg — ABNORMAL HIGH (ref 26.0–34.0)
MCHC: 32.9 g/dL (ref 32.0–36.0)
Monocyte #: 0.9 x10 3/mm (ref 0.2–0.9)
Neutrophil #: 2.6 x10 3/mm (ref 1.4–6.5)
Neutrophil %: 67.5 %
Platelet: 254 x10 3/mm (ref 150–440)
RDW: 17.4 % — ABNORMAL HIGH (ref 11.5–14.5)

## 2011-08-27 LAB — CREATININE, SERUM: EGFR (Non-African Amer.): >60

## 2011-09-06 ENCOUNTER — Ambulatory Visit: Payer: Self-pay | Admitting: Cardiothoracic Surgery

## 2011-10-03 LAB — CBC CANCER CENTER
Basophil #: 0.1 x10 3/mm (ref 0.0–0.1)
Eosinophil #: 0.2 x10 3/mm (ref 0.0–0.7)
HGB: 10.5 g/dL — ABNORMAL LOW (ref 12.0–16.0)
Lymphocyte %: 4.6 %
MCH: 33.1 pg (ref 26.0–34.0)
MCHC: 32.9 g/dL (ref 32.0–36.0)
MCV: 101 fL — ABNORMAL HIGH (ref 80–100)
Neutrophil #: 9.2 x10 3/mm — ABNORMAL HIGH (ref 1.4–6.5)
Neutrophil %: 84 %
Platelet: 189 x10 3/mm (ref 150–440)
RBC: 3.17 10*6/uL — ABNORMAL LOW (ref 3.80–5.20)

## 2011-10-03 LAB — COMPREHENSIVE METABOLIC PANEL
Albumin: 3.4 g/dL (ref 3.4–5.0)
Alkaline Phosphatase: 123 U/L (ref 50–136)
Calcium, Total: 9.3 mg/dL (ref 8.5–10.1)
Co2: 27 mmol/L (ref 21–32)
EGFR (African American): 57 — ABNORMAL LOW
Glucose: 165 mg/dL — ABNORMAL HIGH (ref 65–99)
Osmolality: 266 (ref 275–301)
SGPT (ALT): 15 U/L (ref 12–78)
Sodium: 130 mmol/L — ABNORMAL LOW (ref 136–145)

## 2011-10-07 ENCOUNTER — Ambulatory Visit: Payer: Self-pay | Admitting: Cardiothoracic Surgery

## 2011-10-16 ENCOUNTER — Ambulatory Visit: Payer: Self-pay | Admitting: Internal Medicine

## 2011-11-14 ENCOUNTER — Ambulatory Visit: Payer: Self-pay | Admitting: Oncology

## 2011-11-14 LAB — CBC CANCER CENTER
Basophil #: 0 x10 3/mm (ref 0.0–0.1)
Basophil %: 0 %
Eosinophil #: 0.2 x10 3/mm (ref 0.0–0.7)
HCT: 27.7 % — ABNORMAL LOW (ref 35.0–47.0)
HGB: 9.8 g/dL — ABNORMAL LOW (ref 12.0–16.0)
Lymphocyte #: 0.5 x10 3/mm — ABNORMAL LOW (ref 1.0–3.6)
Lymphocyte %: 9.7 %
MCH: 33.8 pg (ref 26.0–34.0)
MCHC: 35.4 g/dL (ref 32.0–36.0)
MCV: 96 fL (ref 80–100)
Neutrophil #: 4 x10 3/mm (ref 1.4–6.5)
Platelet: 231 x10 3/mm (ref 150–440)
RBC: 2.9 10*6/uL — ABNORMAL LOW (ref 3.80–5.20)

## 2011-11-14 LAB — COMPREHENSIVE METABOLIC PANEL
Alkaline Phosphatase: 112 U/L (ref 50–136)
Bilirubin,Total: 0.2 mg/dL (ref 0.2–1.0)
Calcium, Total: 8.7 mg/dL (ref 8.5–10.1)
Chloride: 93 mmol/L — ABNORMAL LOW (ref 98–107)
EGFR (African American): >60
Glucose: 181 mg/dL — ABNORMAL HIGH (ref 65–99)
SGOT(AST): 15 U/L (ref 15–37)
SGPT (ALT): 12 U/L (ref 12–78)
Total Protein: 6.5 g/dL (ref 6.4–8.2)

## 2011-12-07 ENCOUNTER — Ambulatory Visit: Payer: Self-pay | Admitting: Oncology

## 2012-01-23 ENCOUNTER — Ambulatory Visit: Payer: Self-pay | Admitting: Radiation Oncology

## 2012-01-23 LAB — CREATININE, SERUM
EGFR (African American): >60
EGFR (Non-African Amer.): >60

## 2012-02-06 ENCOUNTER — Ambulatory Visit: Payer: Self-pay | Admitting: Radiation Oncology

## 2012-02-13 LAB — CBC CANCER CENTER
Basophil #: 0.1 x10 3/mm (ref 0.0–0.1)
Basophil %: 1.1 %
Eosinophil #: 0.5 x10 3/mm (ref 0.0–0.7)
Eosinophil %: 7.1 %
HCT: 32.9 % — ABNORMAL LOW (ref 35.0–47.0)
Lymphocyte #: 0.6 x10 3/mm — ABNORMAL LOW (ref 1.0–3.6)
Lymphocyte %: 8 %
MCHC: 33.6 g/dL (ref 32.0–36.0)
MCV: 96 fL (ref 80–100)
Monocyte #: 0.9 x10 3/mm (ref 0.2–0.9)
Monocyte %: 11.8 %
Neutrophil #: 5.2 x10 3/mm (ref 1.4–6.5)
Platelet: 277 x10 3/mm (ref 150–440)
RBC: 3.44 10*6/uL — ABNORMAL LOW (ref 3.80–5.20)
RDW: 15.2 % — ABNORMAL HIGH (ref 11.5–14.5)
WBC: 7.2 x10 3/mm (ref 3.6–11.0)

## 2012-02-13 LAB — COMPREHENSIVE METABOLIC PANEL
Albumin: 3.5 g/dL (ref 3.4–5.0)
Alkaline Phosphatase: 111 U/L (ref 50–136)
Anion Gap: 11 (ref 7–16)
Bilirubin,Total: 0.2 mg/dL (ref 0.2–1.0)
Chloride: 97 mmol/L — ABNORMAL LOW (ref 98–107)
Co2: 26 mmol/L (ref 21–32)
Creatinine: 0.88 mg/dL (ref 0.60–1.30)
EGFR (African American): >60
Glucose: 131 mg/dL — ABNORMAL HIGH (ref 65–99)
Osmolality: 269 (ref 275–301)
Potassium: 4.3 mmol/L (ref 3.5–5.1)
SGPT (ALT): 16 U/L (ref 12–78)
Sodium: 134 mmol/L — ABNORMAL LOW (ref 136–145)
Total Protein: 7 g/dL (ref 6.4–8.2)

## 2012-03-08 ENCOUNTER — Ambulatory Visit: Payer: Self-pay | Admitting: Radiation Oncology

## 2012-03-12 LAB — CBC CANCER CENTER
Basophil #: 0.1 x10 3/mm (ref 0.0–0.1)
Basophil %: 0.7 %
Eosinophil #: 0.4 x10 3/mm (ref 0.0–0.7)
HCT: 33.1 % — ABNORMAL LOW (ref 35.0–47.0)
HGB: 11.5 g/dL — ABNORMAL LOW (ref 12.0–16.0)
Lymphocyte %: 4.6 %
Neutrophil #: 6.3 x10 3/mm (ref 1.4–6.5)
Platelet: 254 x10 3/mm (ref 150–440)
RBC: 3.49 10*6/uL — ABNORMAL LOW (ref 3.80–5.20)
RDW: 14.1 % (ref 11.5–14.5)
WBC: 7.9 x10 3/mm (ref 3.6–11.0)

## 2012-03-12 LAB — COMPREHENSIVE METABOLIC PANEL
Alkaline Phosphatase: 113 U/L (ref 50–136)
Anion Gap: 11 (ref 7–16)
BUN: 16 mg/dL (ref 7–18)
Bilirubin,Total: 0.2 mg/dL (ref 0.2–1.0)
Calcium, Total: 9.4 mg/dL (ref 8.5–10.1)
Co2: 26 mmol/L (ref 21–32)
Creatinine: 1.05 mg/dL (ref 0.60–1.30)
EGFR (African American): >60
EGFR (Non-African Amer.): 58 — ABNORMAL LOW
Potassium: 4.2 mmol/L (ref 3.5–5.1)
SGPT (ALT): 12 U/L (ref 12–78)

## 2012-03-12 LAB — PROTIME-INR
INR: 1
Prothrombin Time: 13.1 secs (ref 11.5–14.7)

## 2012-04-05 ENCOUNTER — Ambulatory Visit: Payer: Self-pay | Admitting: Radiation Oncology

## 2012-05-06 ENCOUNTER — Ambulatory Visit: Payer: Self-pay | Admitting: Oncology

## 2012-05-13 ENCOUNTER — Ambulatory Visit: Payer: Self-pay | Admitting: Oncology

## 2012-05-14 LAB — PROTIME-INR: Prothrombin Time: 12.6 secs (ref 11.5–14.7)

## 2012-05-14 LAB — APTT: Activated PTT: 30.4 secs (ref 23.6–35.9)

## 2012-05-20 ENCOUNTER — Ambulatory Visit: Payer: Self-pay | Admitting: Oncology

## 2012-06-05 ENCOUNTER — Ambulatory Visit: Payer: Self-pay | Admitting: Oncology

## 2012-07-21 ENCOUNTER — Ambulatory Visit: Payer: Self-pay | Admitting: Radiation Oncology

## 2012-07-23 ENCOUNTER — Ambulatory Visit: Payer: Self-pay | Admitting: Oncology

## 2012-07-24 ENCOUNTER — Ambulatory Visit: Payer: Self-pay | Admitting: Radiation Oncology

## 2012-07-24 LAB — CBC CANCER CENTER
Basophil #: 0 x10 3/mm (ref 0.0–0.1)
Basophil %: 0.7 %
Eosinophil #: 0.5 x10 3/mm (ref 0.0–0.7)
Eosinophil %: 7.1 %
HCT: 32.2 % — ABNORMAL LOW (ref 35.0–47.0)
MCH: 32.7 pg (ref 26.0–34.0)
MCHC: 34.5 g/dL (ref 32.0–36.0)
MCV: 95 fL (ref 80–100)
Neutrophil %: 71.7 %
Platelet: 278 x10 3/mm (ref 150–440)
RBC: 3.4 10*6/uL — ABNORMAL LOW (ref 3.80–5.20)
RDW: 14.8 % — ABNORMAL HIGH (ref 11.5–14.5)

## 2012-07-24 LAB — COMPREHENSIVE METABOLIC PANEL
Albumin: 3.4 g/dL (ref 3.4–5.0)
Alkaline Phosphatase: 140 U/L — ABNORMAL HIGH (ref 50–136)
BUN: 14 mg/dL (ref 7–18)
Calcium, Total: 9.4 mg/dL (ref 8.5–10.1)
Chloride: 101 mmol/L (ref 98–107)
Co2: 29 mmol/L (ref 21–32)
Creatinine: 1.07 mg/dL (ref 0.60–1.30)
EGFR (African American): >60
Glucose: 136 mg/dL — ABNORMAL HIGH (ref 65–99)
Osmolality: 278 (ref 275–301)
Potassium: 4.2 mmol/L (ref 3.5–5.1)
SGOT(AST): 10 U/L — ABNORMAL LOW (ref 15–37)
Sodium: 138 mmol/L (ref 136–145)

## 2012-07-28 IMAGING — CR DG CHEST 1V PORT
1 series · 1 of 1 positions shown · non-contrast
Comparison: none

REASON FOR EXAM: right lung mass biopsy
COMMENTS:

PROCEDURE:     DXR - DXR PORTABLE CHEST SINGLE VIEW  - February 26, 2011 [DATE]
RESULT:     Comparison: Earlier same day

[portable]
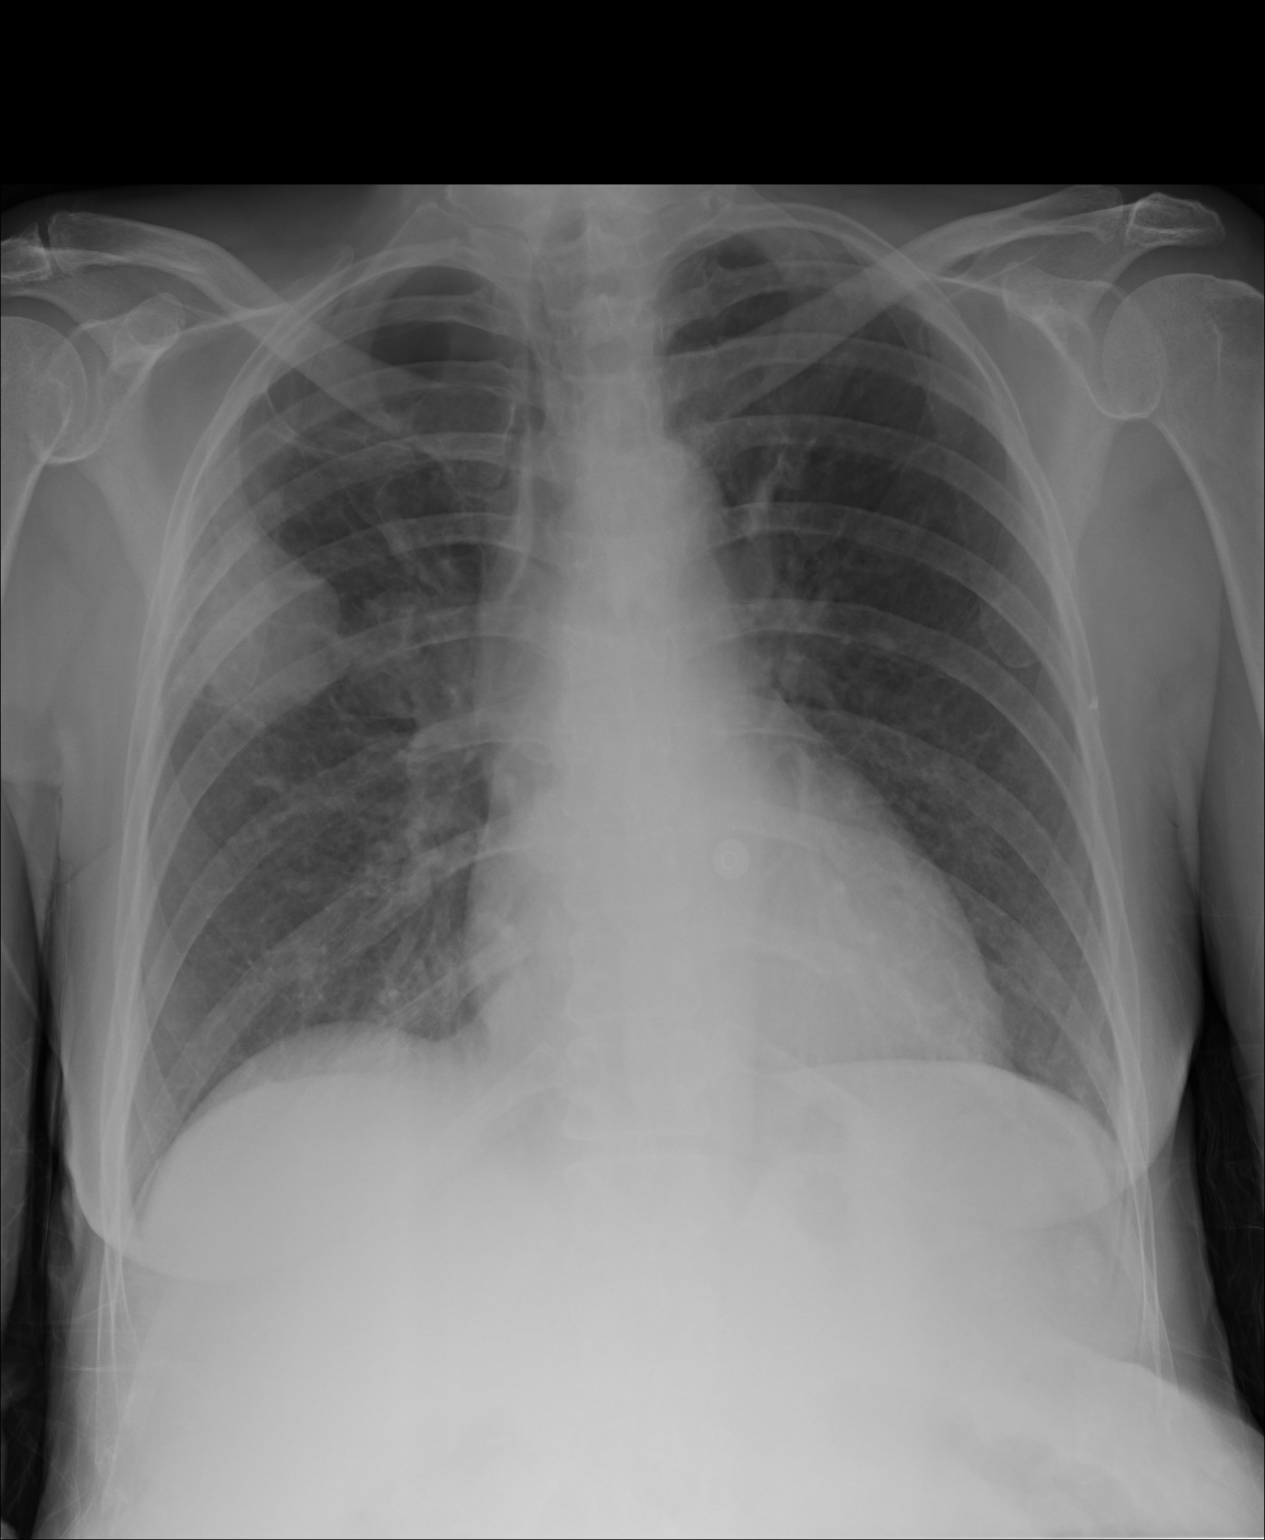

[1 of 1 positions shown; findings below may reference images not displayed]

FINDINGS: Single portable AP chest radiograph is provided. There is a right upper lobe
pulmonary mass. There is no pneumothorax status post right lung biopsy.
There is a right apical bulla. Normal cardiomediastinal silhouette. The
osseous structures are unremarkable.
IMPRESSION: No right pneumothorax status post right lung biopsy.

## 2012-07-31 IMAGING — CT CT SIM MISC
1 series · 16 of 32 positions shown, 20 images · non-contrast
Comparison: none

[Series 2: — · axial · 1.17mm/px · z∈[-869,-563]mm · 16 of 98 slices shown, 20 images]
[im 4/98  mediastinal]
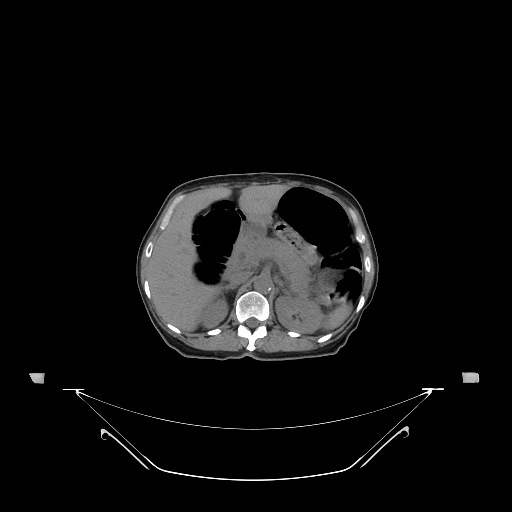
[im 4/98  lung]
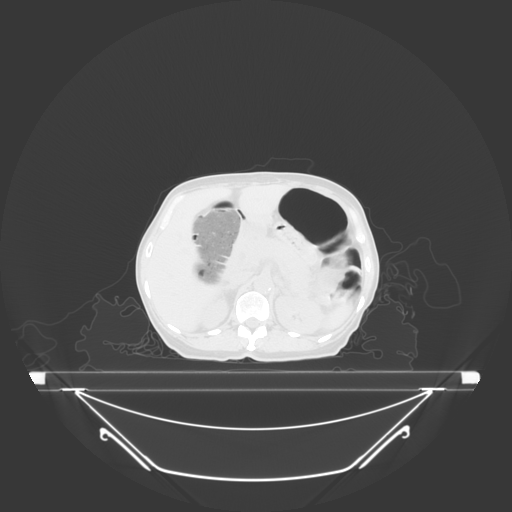
[im 11/98  lung]
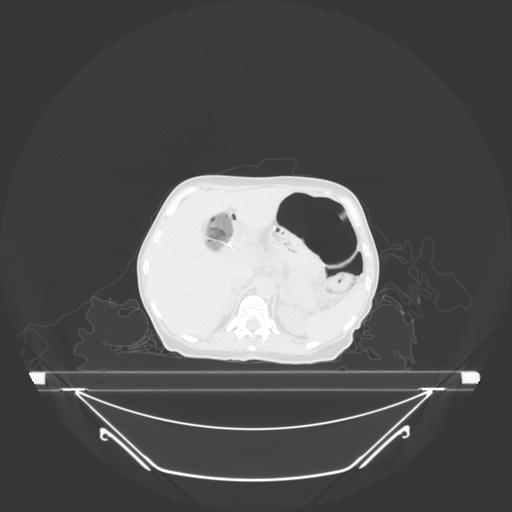
[im 18/98  lung]
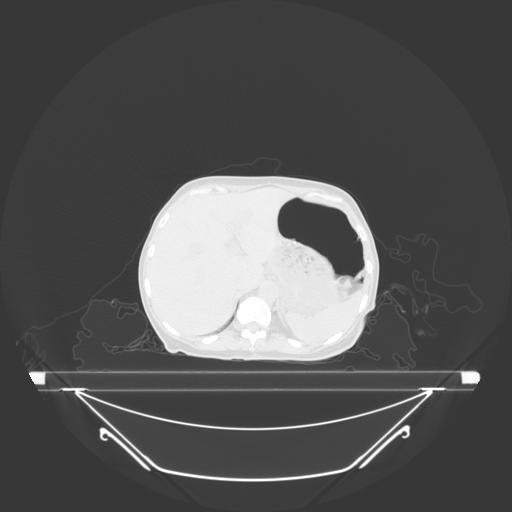
[im 22/98  lung]
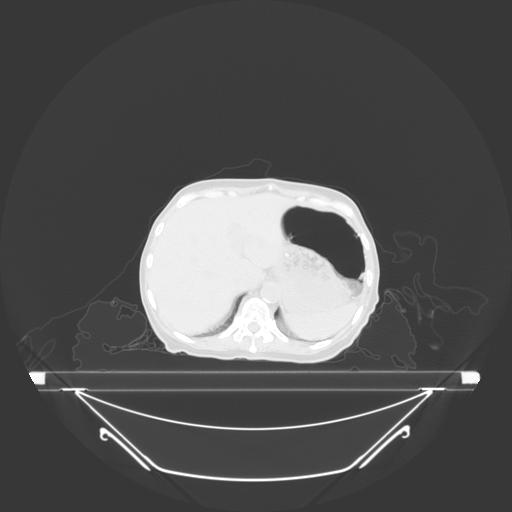
[im 29/98  mediastinal]
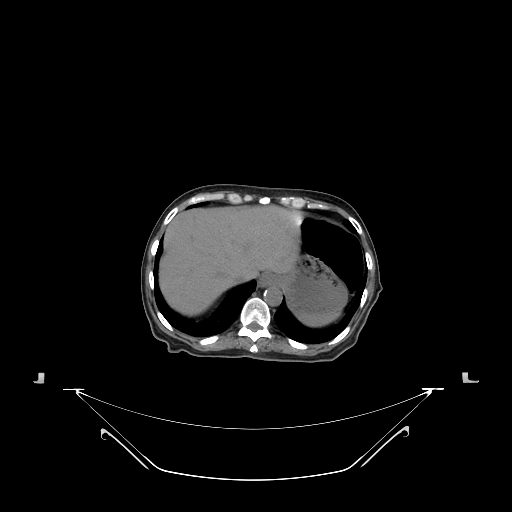
[im 29/98  lung]
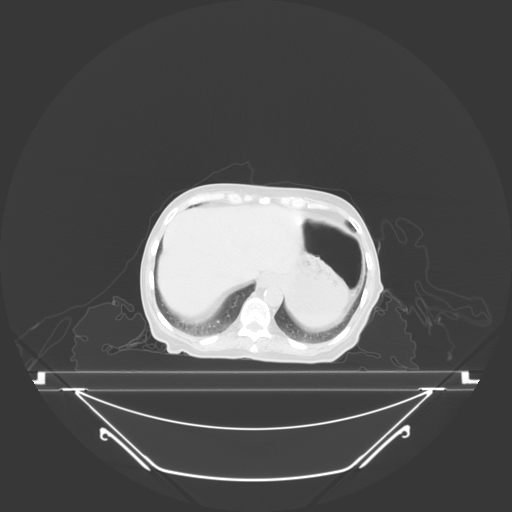
[im 36/98  lung]
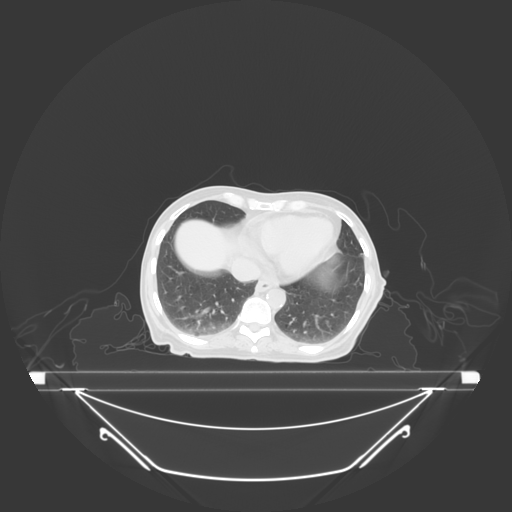
[im 40/98  lung]
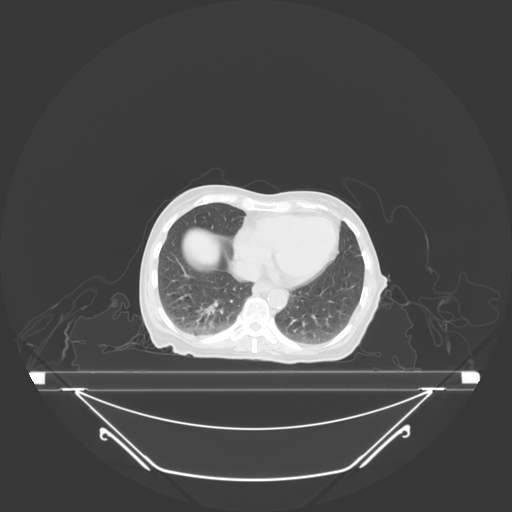
[im 47/98  lung]
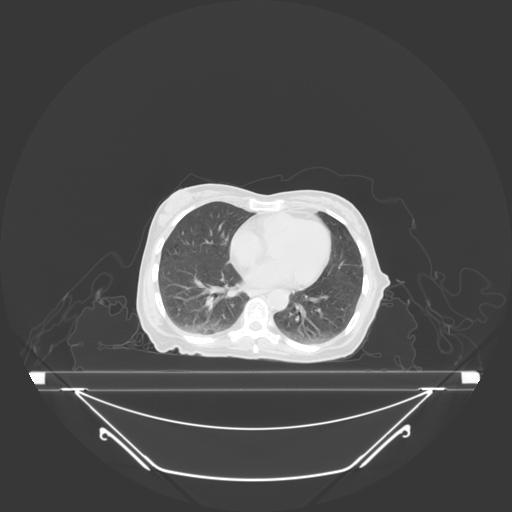
[im 51/98  mediastinal]
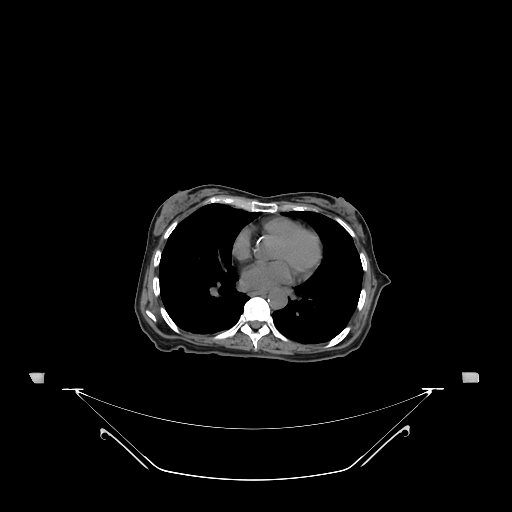
[im 51/98  lung]
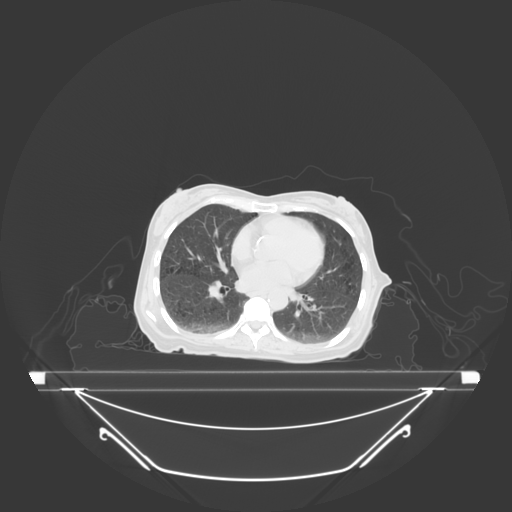
[im 58/98  lung]
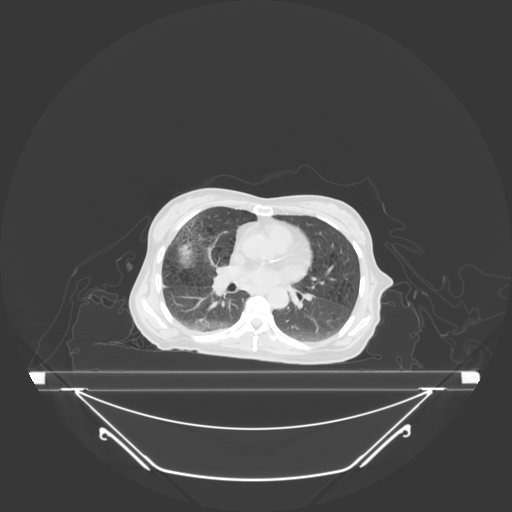
[im 62/98  lung]
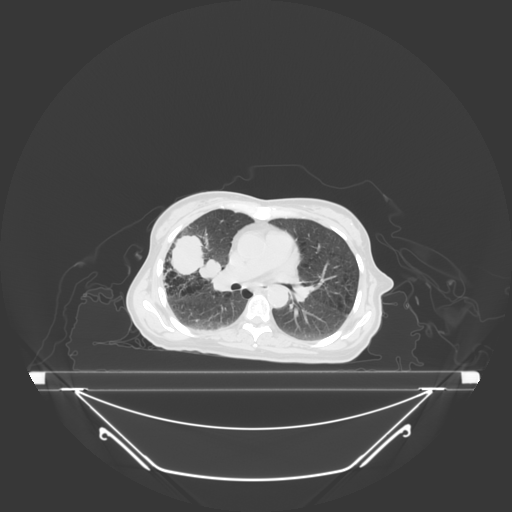
[im 69/98  lung]
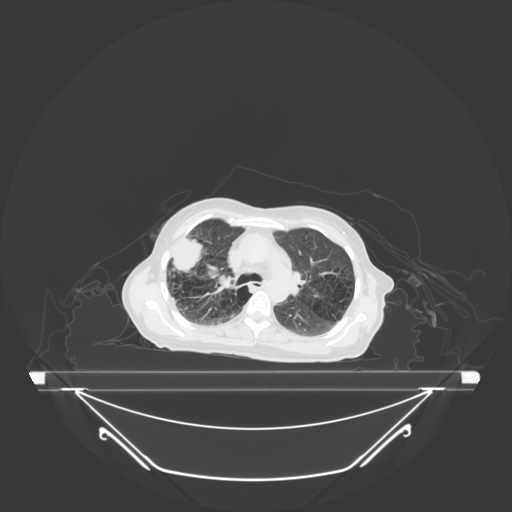
[im 76/98  mediastinal]
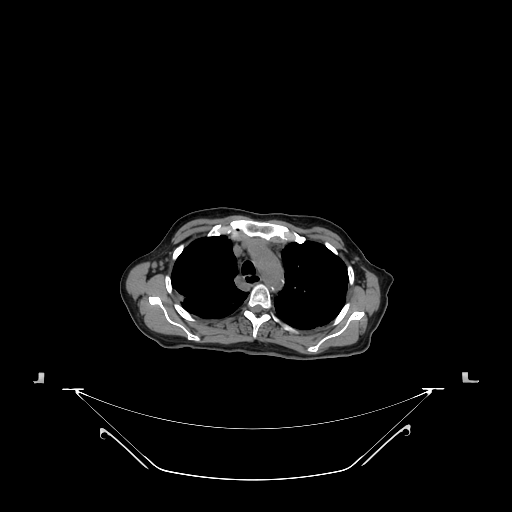
[im 76/98  lung]
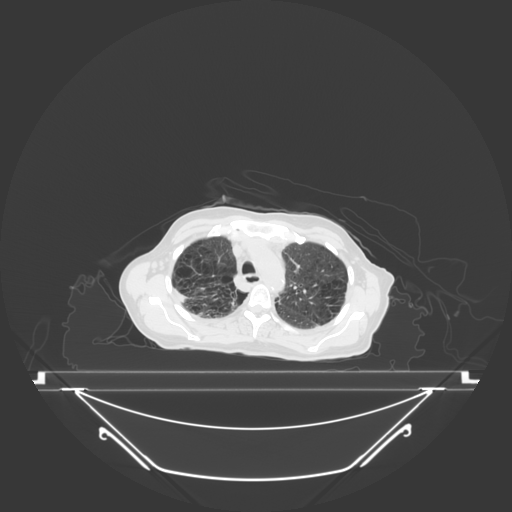
[im 80/98  lung]
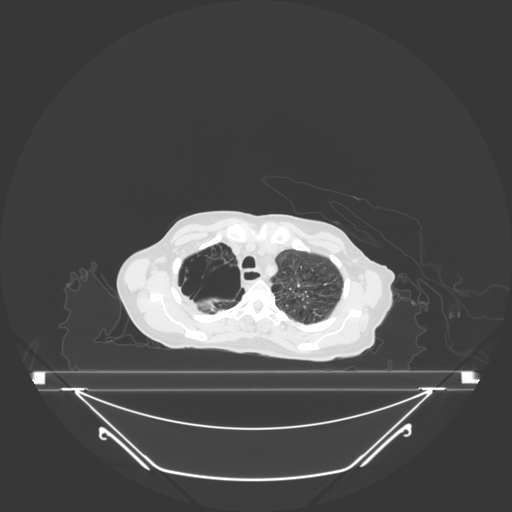
[im 87/98  lung]
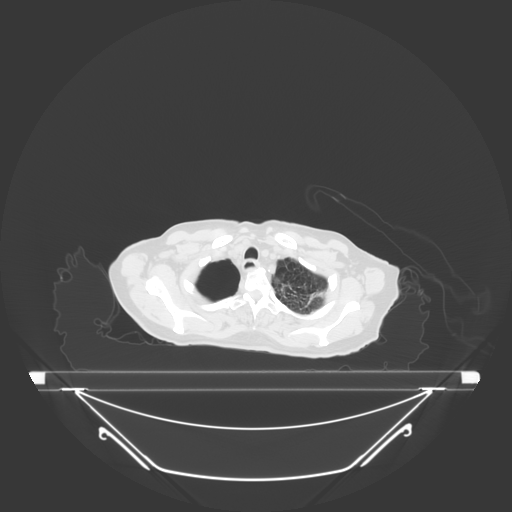
[im 94/98  lung]
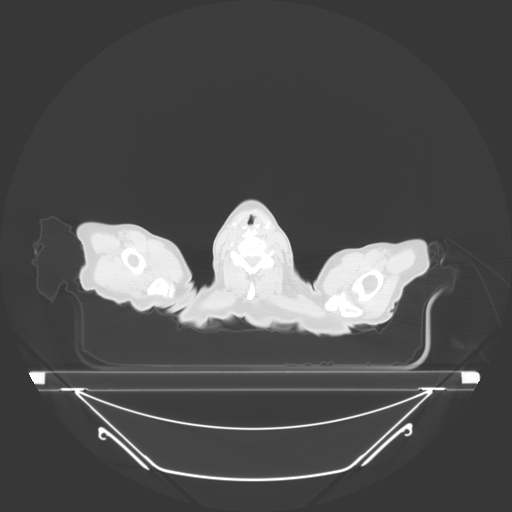

[16 of 32 positions shown; findings below may reference images not displayed]

IMAGES IMPORTED FROM THE [employer] WORKFLOW SYSTEM
NO DICTATION FOR STUDY

## 2012-08-05 ENCOUNTER — Ambulatory Visit: Payer: Self-pay | Admitting: Radiation Oncology

## 2012-08-28 ENCOUNTER — Emergency Department: Payer: Self-pay | Admitting: Emergency Medicine

## 2012-08-29 LAB — COMPREHENSIVE METABOLIC PANEL
Alkaline Phosphatase: 136 U/L (ref 50–136)
Bilirubin,Total: 0.2 mg/dL (ref 0.2–1.0)
Chloride: 97 mmol/L — ABNORMAL LOW (ref 98–107)
Co2: 26 mmol/L (ref 21–32)
EGFR (African American): >60
EGFR (Non-African Amer.): >60
Osmolality: 265 (ref 275–301)
Potassium: 3.8 mmol/L (ref 3.5–5.1)
SGOT(AST): 10 U/L — ABNORMAL LOW (ref 15–37)
Sodium: 132 mmol/L — ABNORMAL LOW (ref 136–145)

## 2012-08-29 LAB — CBC
HGB: 11.1 g/dL — ABNORMAL LOW (ref 12.0–16.0)
MCH: 32.4 pg (ref 26.0–34.0)
MCV: 93 fL (ref 80–100)
Platelet: 253 10*3/uL (ref 150–440)
RBC: 3.41 10*6/uL — ABNORMAL LOW (ref 3.80–5.20)
RDW: 14.2 % (ref 11.5–14.5)

## 2012-10-17 ENCOUNTER — Ambulatory Visit: Payer: Self-pay | Admitting: Internal Medicine

## 2012-10-23 ENCOUNTER — Ambulatory Visit: Payer: Self-pay | Admitting: Oncology

## 2012-10-23 LAB — COMPREHENSIVE METABOLIC PANEL
Alkaline Phosphatase: 165 U/L — ABNORMAL HIGH (ref 50–136)
Anion Gap: 8 (ref 7–16)
Chloride: 86 mmol/L — ABNORMAL LOW (ref 98–107)
EGFR (African American): 54 — ABNORMAL LOW
EGFR (Non-African Amer.): 46 — ABNORMAL LOW
Glucose: 168 mg/dL — ABNORMAL HIGH (ref 65–99)
Osmolality: 251 (ref 275–301)
SGOT(AST): 12 U/L — ABNORMAL LOW (ref 15–37)
SGPT (ALT): 16 U/L (ref 12–78)
Sodium: 123 mmol/L — ABNORMAL LOW (ref 136–145)
Total Protein: 7 g/dL (ref 6.4–8.2)

## 2012-10-23 LAB — CBC CANCER CENTER
Basophil #: 0 x10 3/mm (ref 0.0–0.1)
HCT: 33.2 % — ABNORMAL LOW (ref 35.0–47.0)
Lymphocyte %: 4.5 %
MCH: 31.9 pg (ref 26.0–34.0)
MCHC: 34.5 g/dL (ref 32.0–36.0)
MCV: 93 fL (ref 80–100)
Monocyte #: 0.6 x10 3/mm (ref 0.2–0.9)
Monocyte %: 9.4 %
Neutrophil #: 5.2 x10 3/mm (ref 1.4–6.5)
Neutrophil %: 82.6 %
RBC: 3.59 10*6/uL — ABNORMAL LOW (ref 3.80–5.20)
RDW: 13.7 % (ref 11.5–14.5)
WBC: 6.3 x10 3/mm (ref 3.6–11.0)

## 2012-11-05 ENCOUNTER — Ambulatory Visit: Payer: Self-pay | Admitting: Oncology

## 2012-11-06 ENCOUNTER — Ambulatory Visit: Payer: Self-pay | Admitting: Oncology

## 2012-11-06 LAB — PLATELET COUNT: Platelet: 202 10*3/uL (ref 150–440)

## 2012-11-07 ENCOUNTER — Ambulatory Visit: Payer: Self-pay | Admitting: Oncology

## 2012-11-10 LAB — HEPATIC FUNCTION PANEL A (ARMC)
Albumin: 3.3 g/dL — ABNORMAL LOW (ref 3.4–5.0)
Alkaline Phosphatase: 155 U/L — ABNORMAL HIGH (ref 50–136)
SGPT (ALT): 12 U/L (ref 12–78)
Total Protein: 6.8 g/dL (ref 6.4–8.2)

## 2012-12-06 ENCOUNTER — Ambulatory Visit: Payer: Self-pay | Admitting: Oncology

## 2012-12-22 LAB — CBC CANCER CENTER
Basophil #: 0.1 x10 3/mm (ref 0.0–0.1)
Basophil %: 0.7 %
Eosinophil %: 1.9 %
HGB: 9.8 g/dL — ABNORMAL LOW (ref 12.0–16.0)
Lymphocyte #: 0.3 x10 3/mm — ABNORMAL LOW (ref 1.0–3.6)
Lymphocyte %: 4 %
MCHC: 34.3 g/dL (ref 32.0–36.0)
Monocyte #: 0.9 x10 3/mm (ref 0.2–0.9)
Neutrophil #: 6.6 x10 3/mm — ABNORMAL HIGH (ref 1.4–6.5)
Platelet: 236 x10 3/mm (ref 150–440)
RBC: 3.01 10*6/uL — ABNORMAL LOW (ref 3.80–5.20)
RDW: 15.2 % — ABNORMAL HIGH (ref 11.5–14.5)

## 2012-12-22 LAB — COMPREHENSIVE METABOLIC PANEL
Albumin: 3.4 g/dL (ref 3.4–5.0)
Alkaline Phosphatase: 131 U/L (ref 50–136)
Anion Gap: 8 (ref 7–16)
BUN: 15 mg/dL (ref 7–18)
Calcium, Total: 9.3 mg/dL (ref 8.5–10.1)
Co2: 27 mmol/L (ref 21–32)
Creatinine: 0.98 mg/dL (ref 0.60–1.30)
EGFR (Non-African Amer.): >60
Osmolality: 246 (ref 275–301)
Potassium: 4.5 mmol/L (ref 3.5–5.1)
SGOT(AST): 11 U/L — ABNORMAL LOW (ref 15–37)
SGPT (ALT): 15 U/L (ref 12–78)
Sodium: 121 mmol/L — ABNORMAL LOW (ref 136–145)

## 2013-01-05 ENCOUNTER — Ambulatory Visit: Payer: Self-pay | Admitting: Oncology

## 2013-01-30 ENCOUNTER — Inpatient Hospital Stay: Payer: Self-pay | Admitting: Internal Medicine

## 2013-01-30 LAB — CBC WITH DIFFERENTIAL/PLATELET
Basophil #: 0.1 10*3/uL (ref 0.0–0.1)
Basophil %: 0.6 %
Eosinophil #: 0.2 10*3/uL (ref 0.0–0.7)
Eosinophil %: 2 %
Lymphocyte #: 0.4 10*3/uL — ABNORMAL LOW (ref 1.0–3.6)
Monocyte #: 0.9 x10 3/mm (ref 0.2–0.9)
Monocyte %: 9.3 %
Neutrophil #: 8.1 10*3/uL — ABNORMAL HIGH (ref 1.4–6.5)
RBC: 3.22 10*6/uL — ABNORMAL LOW (ref 3.80–5.20)
RDW: 14.8 % — ABNORMAL HIGH (ref 11.5–14.5)
WBC: 9.6 10*3/uL (ref 3.6–11.0)

## 2013-01-30 LAB — BASIC METABOLIC PANEL
Anion Gap: 8 (ref 7–16)
BUN: 16 mg/dL (ref 7–18)
Calcium, Total: 9.1 mg/dL (ref 8.5–10.1)
Chloride: 96 mmol/L — ABNORMAL LOW (ref 98–107)
Co2: 23 mmol/L (ref 21–32)
Creatinine: 1.48 mg/dL — ABNORMAL HIGH (ref 0.60–1.30)
EGFR (African American): 44 — ABNORMAL LOW
EGFR (Non-African Amer.): 38 — ABNORMAL LOW
Glucose: 135 mg/dL — ABNORMAL HIGH (ref 65–99)
Osmolality: 258 (ref 275–301)
Potassium: 4.4 mmol/L (ref 3.5–5.1)
Sodium: 127 mmol/L — ABNORMAL LOW (ref 136–145)

## 2013-01-30 LAB — URINALYSIS, COMPLETE
Bilirubin,UR: NEGATIVE
Glucose,UR: NEGATIVE mg/dL (ref 0–75)
Protein: 30
Squamous Epithelial: 2

## 2013-01-30 LAB — TROPONIN I: Troponin-I: <0.02 ng/mL

## 2013-01-31 LAB — BASIC METABOLIC PANEL
Anion Gap: 5 — ABNORMAL LOW (ref 7–16)
Chloride: 97 mmol/L — ABNORMAL LOW (ref 98–107)
Co2: 27 mmol/L (ref 21–32)
Creatinine: 0.96 mg/dL (ref 0.60–1.30)
Glucose: 120 mg/dL — ABNORMAL HIGH (ref 65–99)
Osmolality: 261 (ref 275–301)
Potassium: 3.9 mmol/L (ref 3.5–5.1)
Sodium: 129 mmol/L — ABNORMAL LOW (ref 136–145)

## 2013-01-31 LAB — CBC WITH DIFFERENTIAL/PLATELET
Basophil #: 0 10*3/uL (ref 0.0–0.1)
Basophil %: 0.8 %
Eosinophil #: 0.2 10*3/uL (ref 0.0–0.7)
Eosinophil %: 2.7 %
HGB: 9.9 g/dL — ABNORMAL LOW (ref 12.0–16.0)
Lymphocyte %: 5.7 %
MCH: 33.2 pg (ref 26.0–34.0)
MCHC: 34.8 g/dL (ref 32.0–36.0)
Monocyte %: 10.9 %
Neutrophil #: 4.4 10*3/uL (ref 1.4–6.5)
Neutrophil %: 79.9 %
Platelet: 208 10*3/uL (ref 150–440)
RDW: 14.7 % — ABNORMAL HIGH (ref 11.5–14.5)
WBC: 5.5 10*3/uL (ref 3.6–11.0)

## 2013-02-01 LAB — COMPREHENSIVE METABOLIC PANEL
Albumin: 2.8 g/dL — ABNORMAL LOW (ref 3.4–5.0)
Alkaline Phosphatase: 117 U/L
BUN: 15 mg/dL (ref 7–18)
Chloride: 94 mmol/L — ABNORMAL LOW (ref 98–107)
Co2: 28 mmol/L (ref 21–32)
EGFR (African American): >60
EGFR (Non-African Amer.): >60
Glucose: 160 mg/dL — ABNORMAL HIGH (ref 65–99)
Osmolality: 259 (ref 275–301)
Potassium: 3.9 mmol/L (ref 3.5–5.1)
SGOT(AST): 10 U/L — ABNORMAL LOW (ref 15–37)
Sodium: 127 mmol/L — ABNORMAL LOW (ref 136–145)
Total Protein: 6.1 g/dL — ABNORMAL LOW (ref 6.4–8.2)

## 2013-02-01 LAB — CBC WITH DIFFERENTIAL/PLATELET
Eosinophil #: 0.1 10*3/uL (ref 0.0–0.7)
Eosinophil %: 2.6 %
Lymphocyte #: 0.2 10*3/uL — ABNORMAL LOW (ref 1.0–3.6)
MCH: 32.7 pg (ref 26.0–34.0)
MCHC: 34.7 g/dL (ref 32.0–36.0)
Monocyte %: 11.9 %
Neutrophil #: 3.3 10*3/uL (ref 1.4–6.5)
RDW: 15.2 % — ABNORMAL HIGH (ref 11.5–14.5)

## 2013-02-02 LAB — CBC WITH DIFFERENTIAL/PLATELET
Basophil #: 0 10*3/uL (ref 0.0–0.1)
Basophil %: 0.4 %
Eosinophil #: 0.2 10*3/uL (ref 0.0–0.7)
HCT: 27.6 % — ABNORMAL LOW (ref 35.0–47.0)
HGB: 9.7 g/dL — ABNORMAL LOW (ref 12.0–16.0)
MCH: 33.4 pg (ref 26.0–34.0)
Monocyte #: 0.7 x10 3/mm (ref 0.2–0.9)
Monocyte %: 17.4 %
Neutrophil #: 2.9 10*3/uL (ref 1.4–6.5)
Neutrophil %: 69.8 %
Platelet: 190 10*3/uL (ref 150–440)
RDW: 14.9 % — ABNORMAL HIGH (ref 11.5–14.5)
WBC: 4.1 10*3/uL (ref 3.6–11.0)

## 2013-02-02 LAB — COMPREHENSIVE METABOLIC PANEL
Anion Gap: 6 — ABNORMAL LOW (ref 7–16)
BUN: 11 mg/dL (ref 7–18)
Bilirubin,Total: 0.2 mg/dL (ref 0.2–1.0)
Calcium, Total: 8.7 mg/dL (ref 8.5–10.1)
Co2: 27 mmol/L (ref 21–32)
EGFR (African American): >60
SGPT (ALT): 10 U/L — ABNORMAL LOW (ref 12–78)
Total Protein: 6.1 g/dL — ABNORMAL LOW (ref 6.4–8.2)

## 2013-02-02 LAB — VANCOMYCIN, TROUGH: Vancomycin, Trough: 9 ug/mL — ABNORMAL LOW (ref 10–20)

## 2013-02-02 IMAGING — CT CT CHEST-ABD W/ CM
1 of 3 series · 13 of 32 positions shown, 19 images · non-contrast
Comparison: none

REASON FOR EXAM: restaging lung cancer
COMMENTS:

PROCEDURE:     KCT - KCT CHEST AND ABDOMEN W CONTRAST  - September 03, 2011 [DATE]
RESULT:     History: Lung cancer.
Comparison Study: Chest CT 07/03/2011

[Series 2: ch-ab-pel w 3.0 i40f 3 · axial · 0.67mm/px · z∈[-824,-424]mm · 13 of 157 slices shown, 19 images]
[im 12/157  soft-tissue]
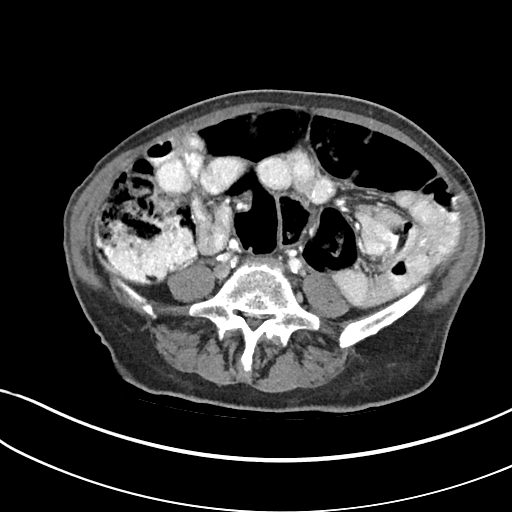
[im 12/157  bone]
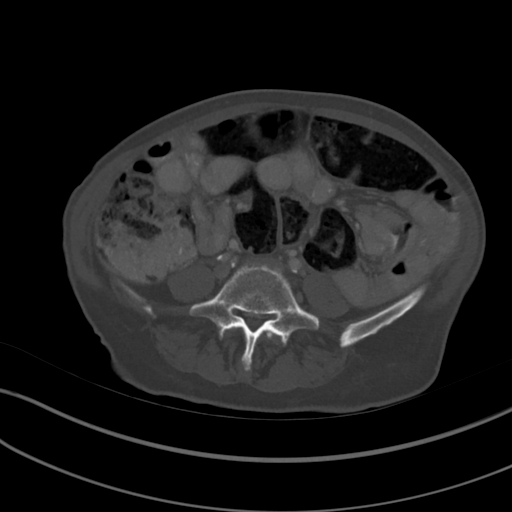
[im 23/157  soft-tissue]
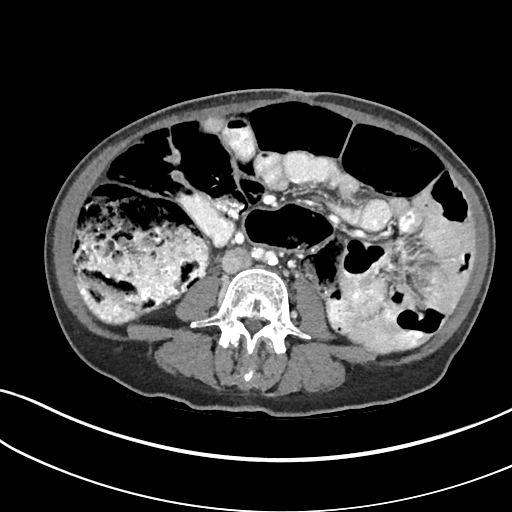
[im 34/157  soft-tissue]
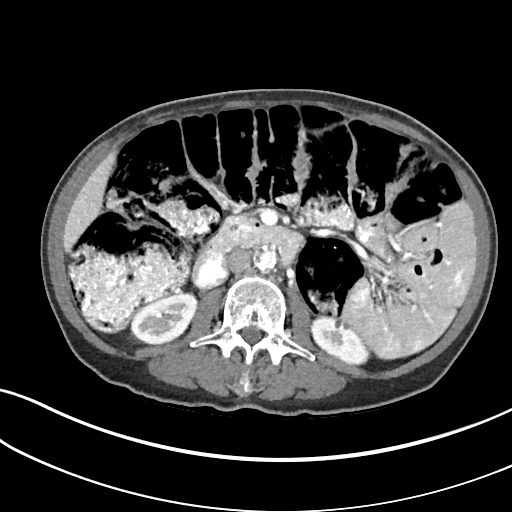
[im 45/157  soft-tissue]
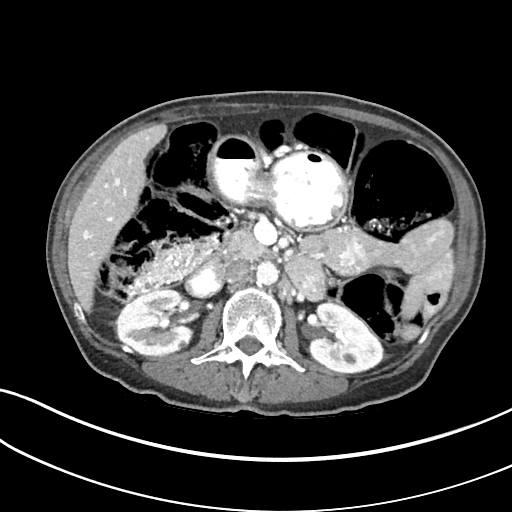
[im 56/157  soft-tissue]
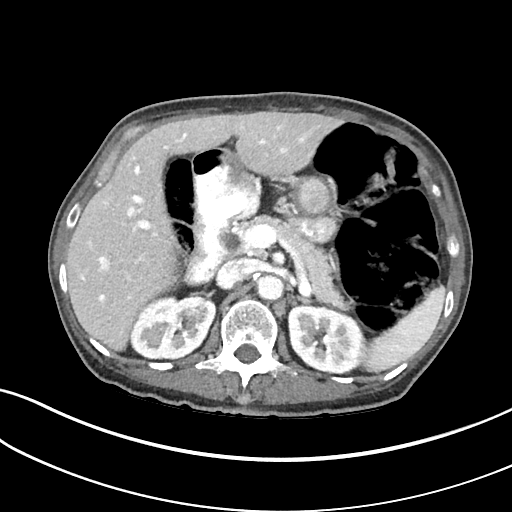
[im 67/157  soft-tissue]
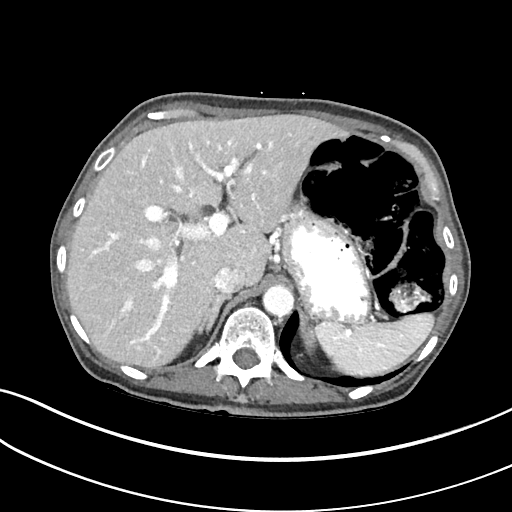
[im 79/157  soft-tissue]
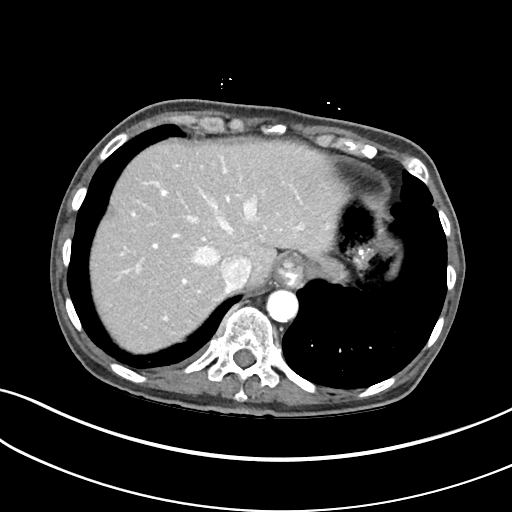
[im 90/157  soft-tissue]
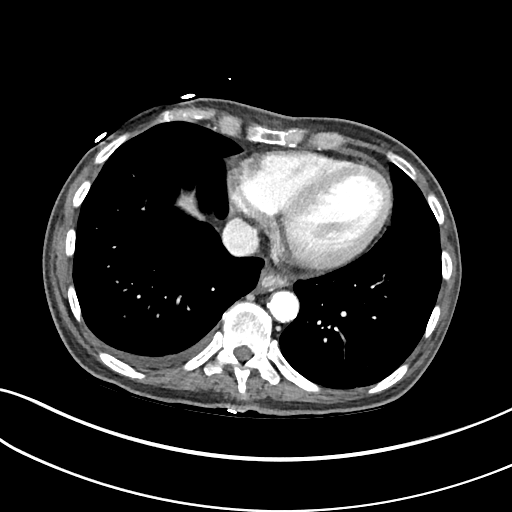
[im 101/157  soft-tissue]
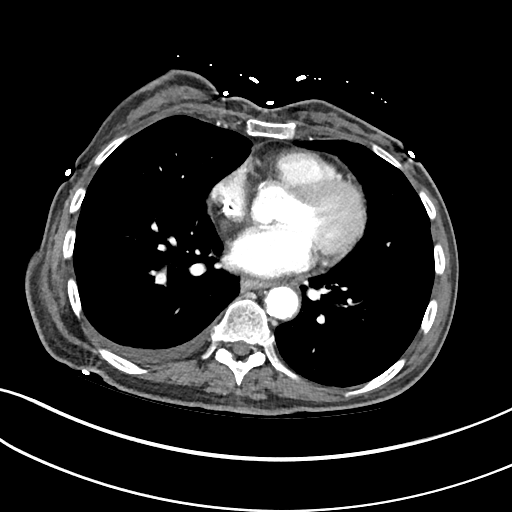
[im 101/157  bone]
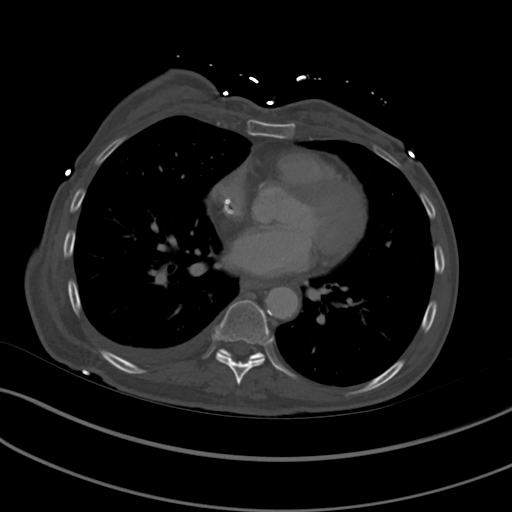
[im 112/157  soft-tissue]
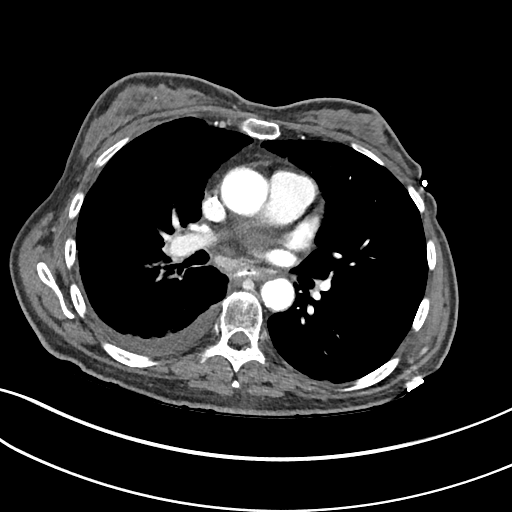
[im 112/157  lung]
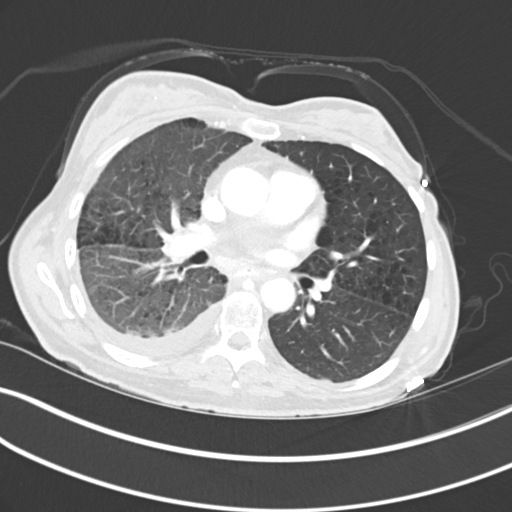
[im 123/157  soft-tissue]
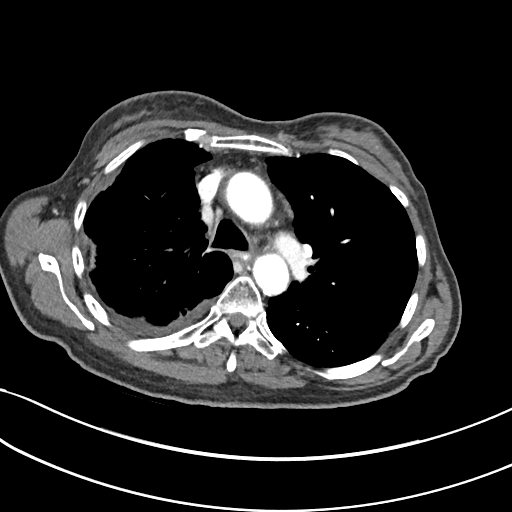
[im 123/157  lung]
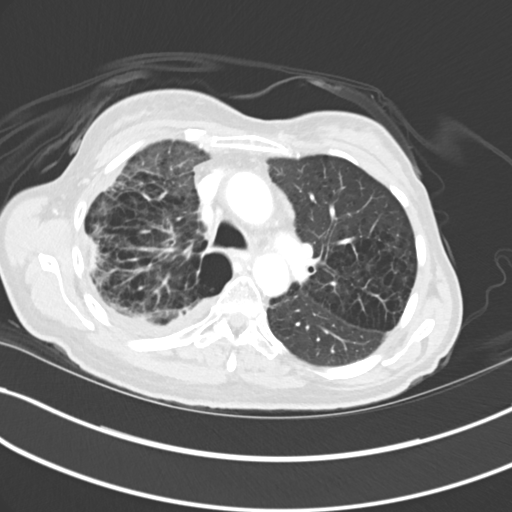
[im 134/157  soft-tissue]
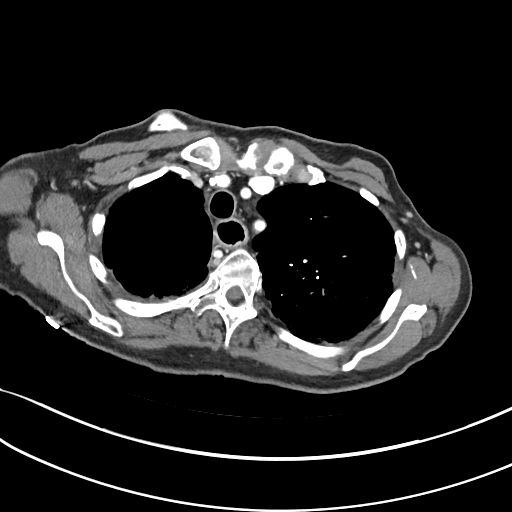
[im 134/157  lung]
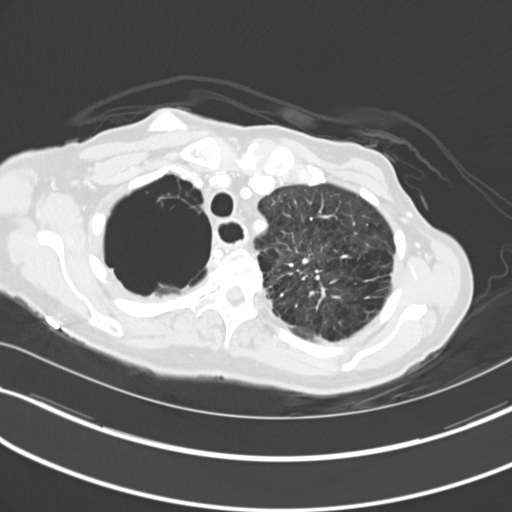
[im 145/157  soft-tissue]
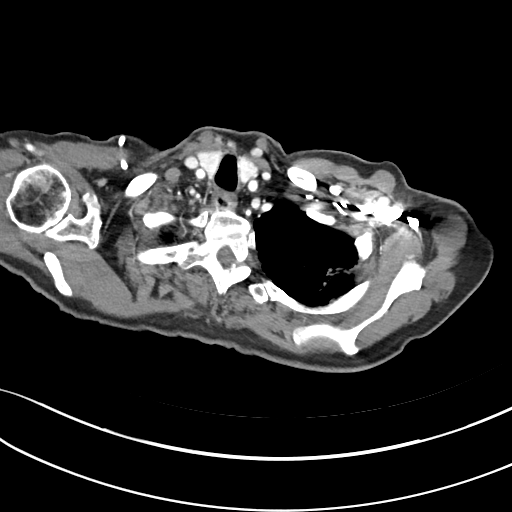
[im 145/157  lung]
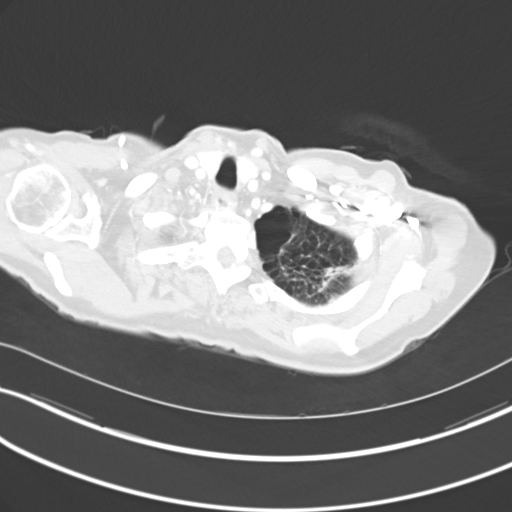

[13 of 32 positions shown; findings below may reference images not displayed]

FINDINGS: Standard CT obtained 85 cc of [id number]. Evaluation in 3
dimensions on separate workstation performed. Thoracic aorta intact.
Adrenals intact. Pulmonary arteries intact. Previously identified right lung
mass is decreased in size to approximately 2.5 cm on today's examination.
Prior maximum diameter was 2.8 cm in Small right pleural effusion noted.
Severe changes of COPD noted. Adrenals are normal.

Mild biliary distention noted. This is stable. Small amount of air within or
system may be from prior intervention. Surgical clip right upper quadrant.
Pancreas normal. Bilateral simple renal cysts. No hydronephrosis. Aorta
nondistended. Prominent distention of colon is again noted. Stool is present
in the colon. Mesenteric vessels including portal vein are patent. Small
bowel are slightly distended.
IMPRESSION: 1. Right upper lung masses diminished slightly in size. Small right pleural
effusion noted.
2. Colonic distention, similar finding noted on prior study 07/03/2011.
Minimal small bowel distention noted.

## 2013-02-03 LAB — CBC WITH DIFFERENTIAL/PLATELET
Basophil #: 0 10*3/uL (ref 0.0–0.1)
Basophil %: 1.1 %
Eosinophil #: 0.2 10*3/uL (ref 0.0–0.7)
HCT: 26.6 % — ABNORMAL LOW (ref 35.0–47.0)
Lymphocyte %: 6.9 %
MCH: 33.1 pg (ref 26.0–34.0)
MCHC: 35.1 g/dL (ref 32.0–36.0)
MCV: 94 fL (ref 80–100)
Monocyte #: 0.7 x10 3/mm (ref 0.2–0.9)
Neutrophil %: 71.3 %
RBC: 2.82 10*6/uL — ABNORMAL LOW (ref 3.80–5.20)
WBC: 4.7 10*3/uL (ref 3.6–11.0)

## 2013-02-04 LAB — BASIC METABOLIC PANEL
Anion Gap: 7 (ref 7–16)
Calcium, Total: 8.8 mg/dL (ref 8.5–10.1)
Chloride: 97 mmol/L — ABNORMAL LOW (ref 98–107)
Creatinine: 0.9 mg/dL (ref 0.60–1.30)
EGFR (African American): >60
EGFR (Non-African Amer.): >60
Glucose: 99 mg/dL (ref 65–99)
Osmolality: 257 (ref 275–301)
Potassium: 3.7 mmol/L (ref 3.5–5.1)

## 2013-02-04 LAB — CULTURE, BLOOD (SINGLE)

## 2013-02-10 ENCOUNTER — Inpatient Hospital Stay: Payer: Self-pay | Admitting: Internal Medicine

## 2013-02-10 ENCOUNTER — Ambulatory Visit: Payer: Self-pay | Admitting: Oncology

## 2013-02-10 LAB — COMPREHENSIVE METABOLIC PANEL
ALK PHOS: 133 U/L — AB
Albumin: 3.4 g/dL (ref 3.4–5.0)
Anion Gap: 8 (ref 7–16)
BILIRUBIN TOTAL: 0.3 mg/dL (ref 0.2–1.0)
BUN: 21 mg/dL — ABNORMAL HIGH (ref 7–18)
CALCIUM: 9.9 mg/dL (ref 8.5–10.1)
CO2: 28 mmol/L (ref 21–32)
Chloride: 90 mmol/L — ABNORMAL LOW (ref 98–107)
Creatinine: 1.07 mg/dL (ref 0.60–1.30)
EGFR (African American): >60
GFR CALC NON AF AMER: 57 — AB
GLUCOSE: 110 mg/dL — AB (ref 65–99)
Osmolality: 257 (ref 275–301)
Potassium: 3.4 mmol/L — ABNORMAL LOW (ref 3.5–5.1)
SGOT(AST): 14 U/L — ABNORMAL LOW (ref 15–37)
SGPT (ALT): 13 U/L (ref 12–78)
Sodium: 126 mmol/L — ABNORMAL LOW (ref 136–145)
Total Protein: 7.3 g/dL (ref 6.4–8.2)

## 2013-02-10 LAB — DRUG SCREEN, URINE
AMPHETAMINES, UR SCREEN: NEGATIVE (ref ?–1000)
BENZODIAZEPINE, UR SCRN: NEGATIVE (ref ?–200)
Barbiturates, Ur Screen: NEGATIVE (ref ?–200)
CANNABINOID 50 NG, UR ~~LOC~~: NEGATIVE (ref ?–50)
COCAINE METABOLITE, UR ~~LOC~~: NEGATIVE (ref ?–300)
MDMA (Ecstasy)Ur Screen: POSITIVE (ref ?–500)
METHADONE, UR SCREEN: NEGATIVE (ref ?–300)
Opiate, Ur Screen: POSITIVE (ref ?–300)
PHENCYCLIDINE (PCP) UR S: NEGATIVE (ref ?–25)
TRICYCLIC, UR SCREEN: NEGATIVE (ref ?–1000)

## 2013-02-10 LAB — URINALYSIS, COMPLETE
BACTERIA: NONE SEEN
BILIRUBIN, UR: NEGATIVE
Blood: NEGATIVE
Glucose,UR: 50 mg/dL (ref 0–75)
Ketone: NEGATIVE
LEUKOCYTE ESTERASE: NEGATIVE
Nitrite: NEGATIVE
PH: 5 (ref 4.5–8.0)
PROTEIN: NEGATIVE
Specific Gravity: 1.015 (ref 1.003–1.030)

## 2013-02-10 LAB — CBC
HCT: 31.4 % — AB (ref 35.0–47.0)
HGB: 10.9 g/dL — ABNORMAL LOW (ref 12.0–16.0)
MCH: 32.9 pg (ref 26.0–34.0)
MCHC: 34.8 g/dL (ref 32.0–36.0)
MCV: 94 fL (ref 80–100)
Platelet: 247 10*3/uL (ref 150–440)
RBC: 3.33 10*6/uL — ABNORMAL LOW (ref 3.80–5.20)
RDW: 14.1 % (ref 11.5–14.5)
WBC: 9.9 10*3/uL (ref 3.6–11.0)

## 2013-02-10 LAB — ETHANOL
Ethanol %: <0.003 % (ref 0.000–0.080)
Ethanol: <3 mg/dL

## 2013-02-10 LAB — MAGNESIUM: MAGNESIUM: 1.6 mg/dL — AB

## 2013-02-10 LAB — PHOSPHORUS: Phosphorus: 2.8 mg/dL (ref 2.5–4.9)

## 2013-02-10 LAB — LITHIUM LEVEL: Lithium: <0.2 mmol/L — ABNORMAL LOW

## 2013-02-11 LAB — BASIC METABOLIC PANEL
Anion Gap: 5 — ABNORMAL LOW (ref 7–16)
BUN: 15 mg/dL (ref 7–18)
CHLORIDE: 98 mmol/L (ref 98–107)
CREATININE: 0.9 mg/dL (ref 0.60–1.30)
Calcium, Total: 8.7 mg/dL (ref 8.5–10.1)
Co2: 27 mmol/L (ref 21–32)
EGFR (African American): >60
EGFR (Non-African Amer.): >60
Glucose: 84 mg/dL (ref 65–99)
Osmolality: 261 (ref 275–301)
Potassium: 3.3 mmol/L — ABNORMAL LOW (ref 3.5–5.1)
SODIUM: 130 mmol/L — AB (ref 136–145)

## 2013-02-11 LAB — CBC WITH DIFFERENTIAL/PLATELET
Basophil #: 0 10*3/uL (ref 0.0–0.1)
Basophil %: 0.5 %
Eosinophil #: 0.1 10*3/uL (ref 0.0–0.7)
Eosinophil %: 1.2 %
HCT: 23.8 % — ABNORMAL LOW (ref 35.0–47.0)
HGB: 8.4 g/dL — ABNORMAL LOW (ref 12.0–16.0)
Lymphocyte #: 0.5 10*3/uL — ABNORMAL LOW (ref 1.0–3.6)
Lymphocyte %: 9 %
MCH: 33.2 pg (ref 26.0–34.0)
MCHC: 35.5 g/dL (ref 32.0–36.0)
MCV: 93 fL (ref 80–100)
Monocyte #: 0.8 x10 3/mm (ref 0.2–0.9)
Monocyte %: 15 %
NEUTROS PCT: 74.3 %
Neutrophil #: 3.8 10*3/uL (ref 1.4–6.5)
Platelet: 164 10*3/uL (ref 150–440)
RBC: 2.54 10*6/uL — AB (ref 3.80–5.20)
RDW: 14.5 % (ref 11.5–14.5)
WBC: 5.1 10*3/uL (ref 3.6–11.0)

## 2013-02-11 LAB — LIPID PANEL
CHOLESTEROL: 60 mg/dL (ref 0–200)
HDL Cholesterol: 18 mg/dL — ABNORMAL LOW (ref 40–60)
Ldl Cholesterol, Calc: 22 mg/dL (ref 0–100)
TRIGLYCERIDES: 102 mg/dL (ref 0–200)
VLDL CHOLESTEROL, CALC: 20 mg/dL (ref 5–40)

## 2013-02-11 LAB — MAGNESIUM: Magnesium: 1.5 mg/dL — ABNORMAL LOW

## 2013-02-11 LAB — HEMOGLOBIN A1C: HEMOGLOBIN A1C: 6.4 % — AB (ref 4.2–6.3)

## 2013-02-11 LAB — TSH: Thyroid Stimulating Horm: 1.48 u[IU]/mL

## 2013-02-12 LAB — BASIC METABOLIC PANEL
Anion Gap: 7 (ref 7–16)
BUN: 9 mg/dL (ref 7–18)
CALCIUM: 8.5 mg/dL (ref 8.5–10.1)
CHLORIDE: 101 mmol/L (ref 98–107)
CO2: 25 mmol/L (ref 21–32)
Creatinine: 0.7 mg/dL (ref 0.60–1.30)
EGFR (African American): >60
GLUCOSE: 88 mg/dL (ref 65–99)
OSMOLALITY: 264 (ref 275–301)
Potassium: 3.3 mmol/L — ABNORMAL LOW (ref 3.5–5.1)
Sodium: 133 mmol/L — ABNORMAL LOW (ref 136–145)

## 2013-02-12 LAB — MAGNESIUM: Magnesium: 1.3 mg/dL — ABNORMAL LOW

## 2013-02-13 LAB — BASIC METABOLIC PANEL
Anion Gap: 5 — ABNORMAL LOW (ref 7–16)
BUN: 9 mg/dL (ref 7–18)
CHLORIDE: 101 mmol/L (ref 98–107)
CO2: 25 mmol/L (ref 21–32)
Calcium, Total: 8.9 mg/dL (ref 8.5–10.1)
Creatinine: 0.77 mg/dL (ref 0.60–1.30)
EGFR (African American): >60
Glucose: 119 mg/dL — ABNORMAL HIGH (ref 65–99)
Osmolality: 262 (ref 275–301)
Potassium: 3.7 mmol/L (ref 3.5–5.1)
Sodium: 131 mmol/L — ABNORMAL LOW (ref 136–145)

## 2013-02-13 LAB — MAGNESIUM: MAGNESIUM: 1.4 mg/dL — AB

## 2013-02-14 ENCOUNTER — Ambulatory Visit: Payer: Self-pay | Admitting: Oncology

## 2013-02-24 LAB — CBC
HCT: 28.7 % — AB (ref 35.0–47.0)
HGB: 9.9 g/dL — AB (ref 12.0–16.0)
MCH: 32.6 pg (ref 26.0–34.0)
MCHC: 34.4 g/dL (ref 32.0–36.0)
MCV: 95 fL (ref 80–100)
Platelet: 222 10*3/uL (ref 150–440)
RBC: 3.04 10*6/uL — ABNORMAL LOW (ref 3.80–5.20)
RDW: 14.5 % (ref 11.5–14.5)
WBC: 4.5 10*3/uL (ref 3.6–11.0)

## 2013-02-25 ENCOUNTER — Inpatient Hospital Stay: Payer: Self-pay | Admitting: Emergency Medicine

## 2013-02-25 LAB — BASIC METABOLIC PANEL
ANION GAP: 6 — AB (ref 7–16)
Anion Gap: 6 — ABNORMAL LOW (ref 7–16)
BUN: 10 mg/dL (ref 7–18)
BUN: 12 mg/dL (ref 7–18)
CHLORIDE: 95 mmol/L — AB (ref 98–107)
CREATININE: 0.67 mg/dL (ref 0.60–1.30)
Calcium, Total: 7.8 mg/dL — ABNORMAL LOW (ref 8.5–10.1)
Calcium, Total: 8.8 mg/dL (ref 8.5–10.1)
Chloride: 99 mmol/L (ref 98–107)
Co2: 25 mmol/L (ref 21–32)
Co2: 24 mmol/L (ref 21–32)
Creatinine: 0.74 mg/dL (ref 0.60–1.30)
EGFR (Non-African Amer.): >60
Glucose: 98 mg/dL (ref 65–99)
Glucose: 125 mg/dL — ABNORMAL HIGH (ref 65–99)
Osmolality: 260 (ref 275–301)
Osmolality: 253 (ref 275–301)
Potassium: 3.7 mmol/L (ref 3.5–5.1)
Potassium: 4.1 mmol/L (ref 3.5–5.1)
SODIUM: 125 mmol/L — AB (ref 136–145)
Sodium: 130 mmol/L — ABNORMAL LOW (ref 136–145)

## 2013-02-25 LAB — TSH
Thyroid Stimulating Horm: 3.58 u[IU]/mL
Thyroid Stimulating Horm: 6.55 u[IU]/mL — ABNORMAL HIGH

## 2013-02-25 LAB — ACETAMINOPHEN LEVEL: Acetaminophen: <2 ug/mL

## 2013-02-25 LAB — URINALYSIS, COMPLETE
BILIRUBIN, UR: NEGATIVE
Blood: NEGATIVE
Glucose,UR: 50 mg/dL (ref 0–75)
KETONE: NEGATIVE
Nitrite: NEGATIVE
Ph: 7 (ref 4.5–8.0)
Protein: NEGATIVE
Specific Gravity: 1.006 (ref 1.003–1.030)
WBC UR: 6 /HPF (ref 0–5)

## 2013-02-25 LAB — COMPREHENSIVE METABOLIC PANEL
ALT: 14 U/L (ref 12–78)
ANION GAP: 6 — AB (ref 7–16)
Albumin: 3.2 g/dL — ABNORMAL LOW (ref 3.4–5.0)
Alkaline Phosphatase: 114 U/L
BILIRUBIN TOTAL: 0.2 mg/dL (ref 0.2–1.0)
BUN: 14 mg/dL (ref 7–18)
CHLORIDE: 86 mmol/L — AB (ref 98–107)
CO2: 29 mmol/L (ref 21–32)
Calcium, Total: 9.1 mg/dL (ref 8.5–10.1)
Creatinine: 0.88 mg/dL (ref 0.60–1.30)
EGFR (African American): >60
EGFR (Non-African Amer.): >60
Glucose: 132 mg/dL — ABNORMAL HIGH (ref 65–99)
Osmolality: 246 (ref 275–301)
POTASSIUM: 3.8 mmol/L (ref 3.5–5.1)
SGOT(AST): 12 U/L — ABNORMAL LOW (ref 15–37)
Sodium: 121 mmol/L — ABNORMAL LOW (ref 136–145)
Total Protein: 6.3 g/dL — ABNORMAL LOW (ref 6.4–8.2)

## 2013-02-25 LAB — DRUG SCREEN, URINE
Amphetamines, Ur Screen: NEGATIVE (ref ?–1000)
BENZODIAZEPINE, UR SCRN: NEGATIVE (ref ?–200)
Barbiturates, Ur Screen: NEGATIVE (ref ?–200)
CANNABINOID 50 NG, UR ~~LOC~~: NEGATIVE (ref ?–50)
Cocaine Metabolite,Ur ~~LOC~~: NEGATIVE (ref ?–300)
MDMA (Ecstasy)Ur Screen: POSITIVE (ref ?–500)
Methadone, Ur Screen: NEGATIVE (ref ?–300)
OPIATE, UR SCREEN: NEGATIVE (ref ?–300)
PHENCYCLIDINE (PCP) UR S: NEGATIVE (ref ?–25)
TRICYCLIC, UR SCREEN: NEGATIVE (ref ?–1000)

## 2013-02-25 LAB — SALICYLATE LEVEL

## 2013-02-25 LAB — ETHANOL
Ethanol %: <0.003 % (ref 0.000–0.080)
Ethanol: <3 mg/dL

## 2013-02-25 LAB — URIC ACID: Uric Acid: 2.7 mg/dL (ref 2.6–6.0)

## 2013-02-25 LAB — MAGNESIUM: Magnesium: 1.3 mg/dL — ABNORMAL LOW

## 2013-02-26 LAB — BASIC METABOLIC PANEL
Anion Gap: 8 (ref 7–16)
BUN: 10 mg/dL (ref 7–18)
CALCIUM: 8.9 mg/dL (ref 8.5–10.1)
CREATININE: 0.85 mg/dL (ref 0.60–1.30)
Chloride: 93 mmol/L — ABNORMAL LOW (ref 98–107)
Co2: 25 mmol/L (ref 21–32)
GLUCOSE: 115 mg/dL — AB (ref 65–99)
OSMOLALITY: 253 (ref 275–301)
Potassium: 3.5 mmol/L (ref 3.5–5.1)
Sodium: 126 mmol/L — ABNORMAL LOW (ref 136–145)

## 2013-02-26 LAB — SODIUM: SODIUM: 125 mmol/L — AB (ref 136–145)

## 2013-02-27 LAB — BASIC METABOLIC PANEL
ANION GAP: 5 — AB (ref 7–16)
BUN: 12 mg/dL (ref 7–18)
CHLORIDE: 101 mmol/L (ref 98–107)
Calcium, Total: 9.2 mg/dL (ref 8.5–10.1)
Co2: 26 mmol/L (ref 21–32)
Creatinine: 1 mg/dL (ref 0.60–1.30)
Glucose: 114 mg/dL — ABNORMAL HIGH (ref 65–99)
Osmolality: 265 (ref 275–301)
Potassium: 4.5 mmol/L (ref 3.5–5.1)
Sodium: 132 mmol/L — ABNORMAL LOW (ref 136–145)

## 2013-03-02 LAB — URINE CULTURE

## 2013-05-25 ENCOUNTER — Ambulatory Visit: Payer: Self-pay | Admitting: Oncology

## 2013-05-26 LAB — COMPREHENSIVE METABOLIC PANEL
Albumin: 3.9 g/dL (ref 3.4–5.0)
Alkaline Phosphatase: 131 U/L — ABNORMAL HIGH
Anion Gap: 6 — ABNORMAL LOW (ref 7–16)
BILIRUBIN TOTAL: 0.2 mg/dL (ref 0.2–1.0)
BUN: 17 mg/dL (ref 7–18)
CO2: 27 mmol/L (ref 21–32)
Calcium, Total: 9.6 mg/dL (ref 8.5–10.1)
Chloride: 98 mmol/L (ref 98–107)
Creatinine: 1.03 mg/dL (ref 0.60–1.30)
GFR CALC NON AF AMER: 59 — AB
GLUCOSE: 94 mg/dL (ref 65–99)
OSMOLALITY: 264 (ref 275–301)
Potassium: 4.4 mmol/L (ref 3.5–5.1)
SGOT(AST): 12 U/L — ABNORMAL LOW (ref 15–37)
SGPT (ALT): 14 U/L (ref 12–78)
Sodium: 131 mmol/L — ABNORMAL LOW (ref 136–145)
Total Protein: 7.3 g/dL (ref 6.4–8.2)

## 2013-05-26 LAB — CBC CANCER CENTER
Basophil #: 0 x10 3/mm (ref 0.0–0.1)
Basophil %: 1 %
EOS ABS: 0.1 x10 3/mm (ref 0.0–0.7)
Eosinophil %: 3.1 %
HCT: 31.8 % — ABNORMAL LOW (ref 35.0–47.0)
HGB: 10.7 g/dL — AB (ref 12.0–16.0)
LYMPHS ABS: 0.2 x10 3/mm — AB (ref 1.0–3.6)
Lymphocyte %: 5.4 %
MCH: 32.4 pg (ref 26.0–34.0)
MCHC: 33.7 g/dL (ref 32.0–36.0)
MCV: 96 fL (ref 80–100)
Monocyte #: 0.5 x10 3/mm (ref 0.2–0.9)
Monocyte %: 12.3 %
Neutrophil #: 3.4 x10 3/mm (ref 1.4–6.5)
Neutrophil %: 78.2 %
Platelet: 207 x10 3/mm (ref 150–440)
RBC: 3.31 10*6/uL — AB (ref 3.80–5.20)
RDW: 14.4 % (ref 11.5–14.5)
WBC: 4.3 x10 3/mm (ref 3.6–11.0)

## 2013-06-05 ENCOUNTER — Ambulatory Visit: Payer: Self-pay | Admitting: Oncology

## 2013-06-25 LAB — COMPREHENSIVE METABOLIC PANEL
ANION GAP: 8 (ref 7–16)
AST: 6 U/L — AB (ref 15–37)
Albumin: 3.8 g/dL (ref 3.4–5.0)
Alkaline Phosphatase: 129 U/L — ABNORMAL HIGH
BILIRUBIN TOTAL: 0.2 mg/dL (ref 0.2–1.0)
BUN: 16 mg/dL (ref 7–18)
Calcium, Total: 9.4 mg/dL (ref 8.5–10.1)
Chloride: 97 mmol/L — ABNORMAL LOW (ref 98–107)
Co2: 30 mmol/L (ref 21–32)
Creatinine: 0.96 mg/dL (ref 0.60–1.30)
EGFR (African American): >60
EGFR (Non-African Amer.): >60
GLUCOSE: 145 mg/dL — AB (ref 65–99)
OSMOLALITY: 274 (ref 275–301)
POTASSIUM: 4.5 mmol/L (ref 3.5–5.1)
SGPT (ALT): 12 U/L (ref 12–78)
SODIUM: 135 mmol/L — AB (ref 136–145)
Total Protein: 7 g/dL (ref 6.4–8.2)

## 2013-06-25 LAB — CBC CANCER CENTER
BASOS ABS: 0.1 x10 3/mm (ref 0.0–0.1)
Basophil %: 1.4 %
EOS ABS: 0.2 x10 3/mm (ref 0.0–0.7)
Eosinophil %: 3.7 %
HCT: 31.4 % — AB (ref 35.0–47.0)
HGB: 10.7 g/dL — ABNORMAL LOW (ref 12.0–16.0)
Lymphocyte #: 0.3 x10 3/mm — ABNORMAL LOW (ref 1.0–3.6)
Lymphocyte %: 4.3 %
MCH: 31.7 pg (ref 26.0–34.0)
MCHC: 34.2 g/dL (ref 32.0–36.0)
MCV: 93 fL (ref 80–100)
MONOS PCT: 12.4 %
Monocyte #: 0.8 x10 3/mm (ref 0.2–0.9)
NEUTROS PCT: 78.2 %
Neutrophil #: 5 x10 3/mm (ref 1.4–6.5)
PLATELETS: 229 x10 3/mm (ref 150–440)
RBC: 3.39 10*6/uL — ABNORMAL LOW (ref 3.80–5.20)
RDW: 14.1 % (ref 11.5–14.5)
WBC: 6.4 x10 3/mm (ref 3.6–11.0)

## 2013-07-06 ENCOUNTER — Ambulatory Visit: Payer: Self-pay | Admitting: Oncology

## 2013-10-23 ENCOUNTER — Ambulatory Visit: Payer: Self-pay | Admitting: Oncology

## 2013-10-23 ENCOUNTER — Ambulatory Visit: Payer: Self-pay | Admitting: Internal Medicine

## 2013-10-24 ENCOUNTER — Ambulatory Visit: Payer: Self-pay | Admitting: Oncology

## 2013-10-26 LAB — CBC CANCER CENTER
BASOS PCT: 1 %
Basophil #: 0.1 x10 3/mm (ref 0.0–0.1)
Eosinophil #: 0.4 x10 3/mm (ref 0.0–0.7)
Eosinophil %: 5.1 %
HCT: 29.3 % — ABNORMAL LOW (ref 35.0–47.0)
HGB: 10 g/dL — ABNORMAL LOW (ref 12.0–16.0)
LYMPHS PCT: 5.9 %
Lymphocyte #: 0.4 x10 3/mm — ABNORMAL LOW (ref 1.0–3.6)
MCH: 32.5 pg (ref 26.0–34.0)
MCHC: 33.9 g/dL (ref 32.0–36.0)
MCV: 96 fL (ref 80–100)
MONO ABS: 0.8 x10 3/mm (ref 0.2–0.9)
Monocyte %: 10.9 %
NEUTROS ABS: 5.8 x10 3/mm (ref 1.4–6.5)
NEUTROS PCT: 77.1 %
Platelet: 286 x10 3/mm (ref 150–440)
RBC: 3.06 10*6/uL — ABNORMAL LOW (ref 3.80–5.20)
RDW: 15.1 % — AB (ref 11.5–14.5)
WBC: 7.6 x10 3/mm (ref 3.6–11.0)

## 2013-10-26 LAB — COMPREHENSIVE METABOLIC PANEL
ALBUMIN: 3.6 g/dL (ref 3.4–5.0)
ALK PHOS: 126 U/L — AB
ANION GAP: 8 (ref 7–16)
AST: 11 U/L — AB (ref 15–37)
BUN: 19 mg/dL — ABNORMAL HIGH (ref 7–18)
Bilirubin,Total: 0.2 mg/dL (ref 0.2–1.0)
Calcium, Total: 9.5 mg/dL (ref 8.5–10.1)
Chloride: 97 mmol/L — ABNORMAL LOW (ref 98–107)
Co2: 27 mmol/L (ref 21–32)
Creatinine: 1.1 mg/dL (ref 0.60–1.30)
EGFR (African American): >60
EGFR (Non-African Amer.): 55 — ABNORMAL LOW
Glucose: 143 mg/dL — ABNORMAL HIGH (ref 65–99)
OSMOLALITY: 269 (ref 275–301)
POTASSIUM: 4.9 mmol/L (ref 3.5–5.1)
SGPT (ALT): 18 U/L
SODIUM: 132 mmol/L — AB (ref 136–145)
TOTAL PROTEIN: 6.7 g/dL (ref 6.4–8.2)

## 2013-11-05 ENCOUNTER — Ambulatory Visit: Payer: Self-pay | Admitting: Oncology

## 2014-01-25 ENCOUNTER — Ambulatory Visit: Payer: Self-pay | Admitting: Oncology

## 2014-01-26 LAB — BASIC METABOLIC PANEL
ANION GAP: 11 (ref 7–16)
BUN: 18 mg/dL (ref 7–18)
CHLORIDE: 95 mmol/L — AB (ref 98–107)
CREATININE: 1.08 mg/dL (ref 0.60–1.30)
Calcium, Total: 9.4 mg/dL (ref 8.5–10.1)
Co2: 26 mmol/L (ref 21–32)
EGFR (African American): >60
GFR CALC NON AF AMER: 55 — AB
GLUCOSE: 116 mg/dL — AB (ref 65–99)
OSMOLALITY: 267 (ref 275–301)
Potassium: 5.3 mmol/L — ABNORMAL HIGH (ref 3.5–5.1)
Sodium: 132 mmol/L — ABNORMAL LOW (ref 136–145)

## 2014-01-26 LAB — CBC CANCER CENTER
Basophil #: 0 x10 3/mm (ref 0.0–0.1)
Basophil %: 0 %
EOS ABS: 0.4 x10 3/mm (ref 0.0–0.7)
Eosinophil %: 4.3 %
HCT: 30.5 % — ABNORMAL LOW (ref 35.0–47.0)
HGB: 10.2 g/dL — ABNORMAL LOW (ref 12.0–16.0)
LYMPHS PCT: 5.9 %
Lymphocyte #: 0.5 x10 3/mm — ABNORMAL LOW (ref 1.0–3.6)
MCH: 31.1 pg (ref 26.0–34.0)
MCHC: 33.5 g/dL (ref 32.0–36.0)
MCV: 93 fL (ref 80–100)
Monocyte #: 0.9 x10 3/mm (ref 0.2–0.9)
Monocyte %: 9.8 %
Neutrophil #: 7.2 x10 3/mm — ABNORMAL HIGH (ref 1.4–6.5)
Neutrophil %: 80 %
Platelet: 273 x10 3/mm (ref 150–440)
RBC: 3.28 10*6/uL — AB (ref 3.80–5.20)
RDW: 14.1 % (ref 11.5–14.5)
WBC: 9 x10 3/mm (ref 3.6–11.0)

## 2014-02-05 ENCOUNTER — Ambulatory Visit: Payer: Self-pay | Admitting: Oncology

## 2014-02-27 ENCOUNTER — Emergency Department: Payer: Self-pay | Admitting: Emergency Medicine

## 2014-03-04 DIAGNOSIS — S42209A Unspecified fracture of upper end of unspecified humerus, initial encounter for closed fracture: Secondary | ICD-10-CM | POA: Insufficient documentation

## 2014-03-04 DIAGNOSIS — S82033A Displaced transverse fracture of unspecified patella, initial encounter for closed fracture: Secondary | ICD-10-CM | POA: Insufficient documentation

## 2014-04-08 IMAGING — CR DG CHEST 1V
1 series · 1 of 1 positions shown · non-contrast
Comparison: none

REASON FOR EXAM: Lung cancer with pleural effusion
COMMENTS:

PROCEDURE:     DXR - DXR CHEST 1 VIEW PRE AUJLA  - November 06, 2012 [DATE]
RESULT:

[pa]
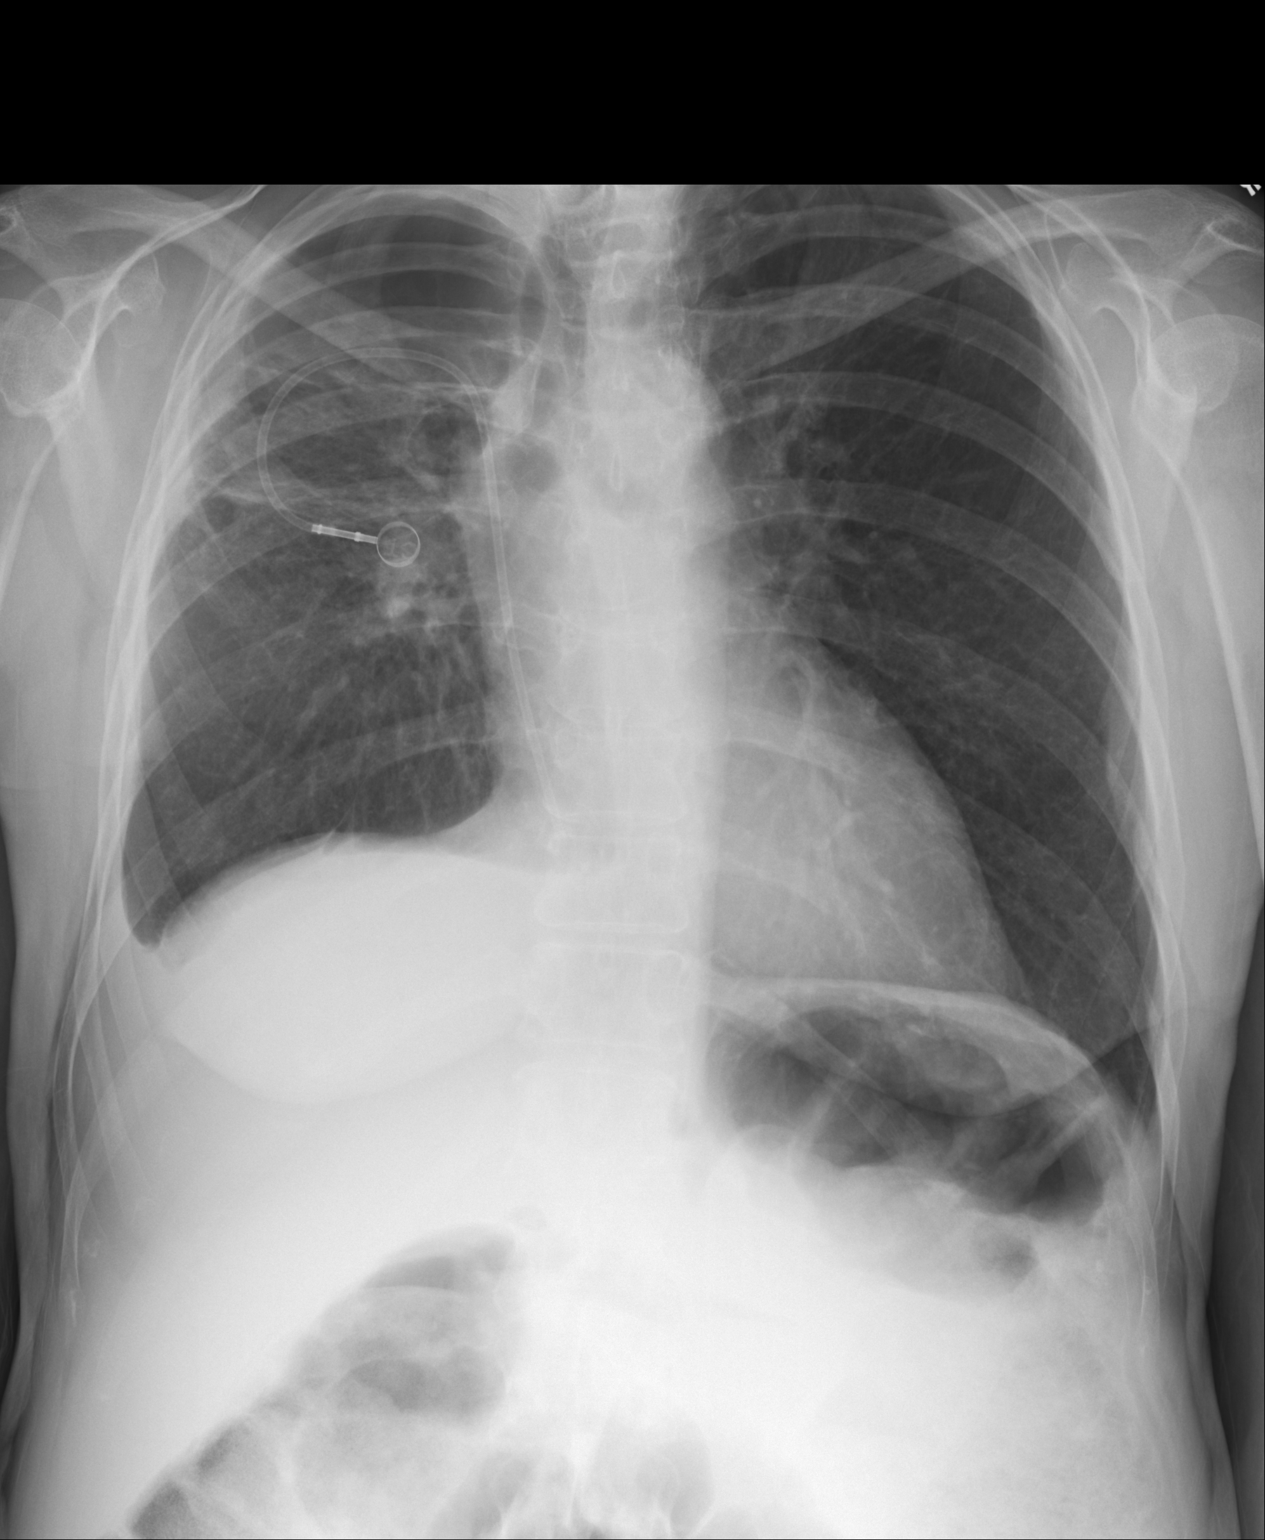

[1 of 1 positions shown; findings below may reference images not displayed]

FINDINGS: An area of increased density with a consolidative component
projects within the right lung base. There is blunting of the costophrenic
angle. Ill-defined area of increased density projects within the right
perihilar region. The left hemithorax demonstrates no evidence of focal
infiltrates, effusions or evidence of edema. The cardiac silhouette and
visualized bony skeleton is unremarkable. A right sided Port-A-Cath is
appreciated with the tip projecting in the region of the superior vena
cava/right atrial junction.
IMPRESSION: 1. Density within the right lung base, a component of which likely
represents a pleural effusion. Atelectasis versus infiltrate versus a mass
or consolidated lung cannot be excluded.
2.  Ill-defined density in the right perihilar region. Differential
considerations are infiltrate versus atelectasis and again, considering the
patient's Port-A-Cath, a small mass or nodule or possibly infiltrating
neoplasia cannot be excluded.

## 2014-05-24 ENCOUNTER — Ambulatory Visit
Admit: 2014-05-24 | Disposition: A | Discharge status: Home / Self Care | Payer: Self-pay | Attending: Oncology | Admitting: Oncology

## 2014-05-29 NOTE — H&P (Signed)
PATIENT NAME:  Grace Mcguire, Grace Mcguire MR#:  [id number] DATE OF BIRTH:  1955/04/06  DATE OF ADMISSION:  02/24/2013  PRIMARY CARE PHYSICIAN: Alfredia Ferguson A. Elijio Miles, MD   CHIEF COMPLAINT: Paranoia.   HISTORY OF PRESENT ILLNESS:  This is a 59 year old female with history of schizophrenia who was brought in by [facility] for paranoia, increasing confusion, altered mental status. She was noted to have a low sodium of 128, however, her baseline runs about 127 to 130. She initially had an IVC involuntary commitment placed due to her paranoia behavior. During her ER stay, she received 2 liters normal saline for sodium of 130 when she presented to the ER and she was seen by psychiatry. Dr. Weber Cooks did not feel the patient needed any kind of psychiatric inpatient hospitalization as she is now back to her baseline and her thoughts are clear and she was not acting agitated or bizarre. She continues to be at her baseline, is not acting bizarre.  She does not appear confused at all. Hospitalist service was asked to admit the patient due to her  sodium level of now 125 after 2 liters of normal saline. She was recently here in January. At that time, she was hyponatremic, thought to have a seizure although her EEG was negative. She was seen by neurology. He recommended to keep her sodium level greater than 130 to prevent  seizures and also made some suggestions for medical adjustments for her psychotropic medications.   REVIEW OF SYSTEMS:   CONSTITUTIONAL:  No fever, fatigue or weakness.  EYES:  No blurry vision or double.  No glaucoma or cataracts.  ENT: No ear pain, hearing loss, snoring, postnasal drip.  RESPIRATORY:  No cough, wheezing hemoptysis. Positive COPD.  CARDIOVASCULAR: No chest pain, orthopnea, edema, arrhythmia, dyspnea on exertion.   GASTROINTESTINAL:  No nausea, vomiting, diarrhea, abdominal pain, melena or ulcers.  GENITOURINARY: No dysuria or hematuria. ENDOCRINE: No polyuria or polydipsia. HEMATOLOGY AND  LYMPHATIC:  No anemia or easy bruising.  SKIN: No rash or lesions.  MUSCULOSKELETAL: No arthritis.  NEURO:  Positive history of TIA.   PSYCH:  Schizophrenia.   PAST MEDICAL HISTORY: 1.  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  2.  TIA.   3.  Schizophrenia.  4.  History of suicide attempts. 5.  Iron deficiency anemia.  6.  GERD.  7.  Hyperlipidemia.  8.  Hypertension.  9.  Diabetes.   PAST SURGICAL HISTORY:  Cholecystectomy.   ALLERGIES: :ZYPREXA, RISPERDAL AND LATEX.   MEDICATIONS:  1.  Geodon 100 mg b.i.d.  2.  Vitamin D3 1000 international units daily.  3.  Spiriva 18 mcg daily.  4.  Ramipril 10 mg daily.  5.  Polyethylene glycol 17 grams in 8 ounces daily p.r.n.  6.  Pepto-Bismol p.r.n.  7.  Zofran 4 mg q.6 hours p.r.n.  8.  Omeprazole 20 mg daily.  9.  Metformin 1000 b.i.d.  10.  Magnesium oxide 400 mg t.i.d.  11.  Lactulose 15 mg t.i.d. p.r.n.  12.  Januvia 50 mg daily.  13.  Haldol 10 mg daily.  14.  Guaifenesin 100 mg/5 mL, 10 mL q.4 hours p.r.n.  15.  Donepezil 10 mg daily.  16.  Vitamin B12 1000 mL monthly.  17.  Colace 100 mg b.i.d.  18.  Chlorthalidone 25 mg 1/2 tablet daily.  19.  Coreg 25 mg b.i.d.  20.  Bupropion 150 mg daily.  21.  Benztropine 2 mg b.i.d.  22.  Atorvastatin 10 mg daily. 23.  Ativan 0.5 mg q.8 hours p.r.n.  24.   Acetaminophen/butalbital/caffeine 1 tablet q.4 hours p.r.n. headache.   SOCIAL HISTORY: The patient is a resident at [facility]. Quit smoking about a year ago, with smoking 40 pack-years. No alcohol or IV drug use.   FAMILY HISTORY: Positive for CAD.    PHYSICAL EXAMINATION: VITAL SIGNS: Temperature 98, pulse 85, respirations 18, blood pressure 128/67, 98% on room air.  GENERAL: The patient is alert, oriented, not in acute distress.  HEENT:  Head is atraumatic.  Pupils equal, round.  Sclerae anicteric. Mucous membranes are moist. Oropharynx is clear.  NECK: Supple without JVD,  carotid bruit or enlarged thyroid.  CARDIOVASCULAR: Regular rate and rhythm. No murmurs, gallops or rubs. PMI is nondisplaced.  LUNGS: Clear to auscultation without crackles, rales, rhonchi or wheezing. Normal percussion. Normal chest expansion.  ABDOMEN: Bowel sounds positive. Nontender, nondistended. No hepatosplenomegaly. No rebound, guarding or ecchymosis.  EXTREMITIES: No clubbing, cyanosis or edema.   BACK:  No CVA or vertebral tenderness.   NEUROLOGIC: Cranial nerves II through XII are intact. There are no focal deficits. No abnormal cerebellar exam.  MUSCULOSKELETAL: Strength in all extremities 4 out of 4.    LABORATORY DATA:  Sodium 125 after 2 liters of fluid were given, initially her sodium was 130, potassium 4.1, chloride 95, bicarb 24, BUN 12, creatinine 0.74, glucose 125. Urine toxicology is positive for MDMA.  White blood cells 4.5, hemoglobin 10, hematocrit 29, platelet 222. Urinalysis shows 1+ LCE with 6 white blood cells.   ASSESSMENT AND PLAN: This is a 59 year old female who was brought in from [facility] for altered mental status with paranoia, who incidentally had a sodium of 130 initially, got 2 liters of fluid and now her sodium is 125. Hospitalist was consulted for admission for hyponatremia.  1.  Hyponatremia. 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 Her baseline is anywhere between 127 to 130. She was discharged with a sodium previously of 133.  I suggest that her sodium be greater than 130 as per neurology recommendations to prevent seizures. We will fluid restrict the patient and monitor her sodium level. Will also need to continue with neuro checks. We will also hold her chlorthalidone as this can cause hyponatremia as well. I will check a TSH, urine sodium and uric acid as well.  2.  Schizophrenia. Initially, the patient had an involuntary commitment. Psychiatry has seen the patient. They  have discontinued this. She seems to be at her baseline. We will continue her outpatient medications.  3.  Diabetes. Will continue outpatient medications with the exception of metformin. ADA diet.  4.  Hypertension. We will continue her outpatient medications.  5.  Urinary tract infection. I have started ciprofloxacin, also ordered a urine culture.  6.  CODE STATUS: The patient is full CODE STATUS.   TIME SPENT: Approximately 45 minutes.     ____________________________ Donell Beers. Benjie Karvonen, MD spm:cs D: 02/25/2013 19:44:11 ET T: 02/25/2013 20:09:47 ET JOB#: [id number]  cc: Koua Deeg P. Benjie Karvonen, MD, <Dictator> Venetia Maxon. Elijio Miles, MD Donell Beers Misbah Hornaday MD ELECTRONICALLY SIGNED 02/25/2013 20:38

## 2014-05-29 NOTE — Consult Note (Signed)
Brief Consult Note: Diagnosis: Schizophrenia.   Patient was seen by consultant.   Consult note dictated.   Discussed with Attending MD.   Comments: Patient with schizophrenia and multiple medical illnesses sent here for increased confusion. Currently she is alert and interactive and not acting agitated or bizarre. She was hyponatremic earlier which has been corrected. I think she was delirious earlier and now is improved. Does not need inpatient psych treatment. Discussed with ER doctor. Will dc IVC. No other acute psychiatric intervention needed now. Has appropriate psych treatment back at [facility].  Electronic Signatures: Gonzella Lex (MD)  (Signed 21-Jan-15 17:43)  Authored: Brief Consult Note   Last Updated: 21-Jan-15 17:43 by Gonzella Lex (MD)

## 2014-05-29 NOTE — Consult Note (Signed)
Referring Physician:  Veverly Fells   Primary Care Physician:  Selinda Michaels, [address] [place], [place] [address], [place]  Reason for Consult: Admit Date: 09-Feb-2013  Chief Complaint: seizure  Reason for Consult: seizure   History of Present Illness: History of Present Illness:   59 yo RHD F with schizophrenia presents to [facility] secondary to seizure at group home.  It was listed as generalized tonic without tongue biting or incontinence per chart.  No witnesses are present now.  Pt has never had any episode like this before.  Pt was confused after this and daughter is at bedside and reports  that she has not returned to baseline.  Pt has some baseline dementia per daughter.  She has improved in mentation since initial seizure but is not quite right now.  ROS:  Review of Systems   unobtainable secondary to confusion  Past Medical/Surgical Hx:  Radiation Treatment, current:   Cancer,Lung:   TIA - Transient Ischemic Attack:   hx of suicide attempts:   iron deficiency anemia:   GERD - Esophageal Reflux:   anemia:   lung mass:   Hypercholesterolemia:   shingles:   HTN:   Diabetes:   Schizophrenia- paranoid:   Cholecystectomy:   Past Medical/ Surgical Hx:  Past Medical History as above   Past Surgical History as above   Home Medications: Medication Instructions Last Modified Date/Time  cyanocobalamin 1000 mcg/mL injectable solution 1 milliliter(s) injectable once a week on Wednesday for 14 days (1230) 06-Jan-15 17:21  benztropine 2 mg oral tablet 1 tab(s) orally 2 times a day (0900, 2100) 06-Jan-15 17:21  donepezil 10 mg oral tablet 1 tab(s) orally once a day (at bedtime) (2000) 06-Jan-15 17:21  atorvastatin 10 mg oral tablet 1 tab(s) orally once a day (at bedtime) (2000) 06-Jan-15 17:21  ondansetron 4 mg oral tablet 1 tab(s) orally every 6 hours, As Needed - for Nausea, Vomiting 06-Jan-15 17:21  ramipril 10 mg oral capsule 1  cap(s) orally once a day (0900) 06-Jan-15 17:21  Januvia 50 mg oral tablet 1 tab(s) orally once a day (0900) 06-Jan-15 17:21  metformin 1000 mg oral tablet 1 tab(s) orally 2 times a day (0900, 2100) 06-Jan-15 17:21  carvedilol 25 mg oral tablet 1 tab(s) orally 2 times a day (0900, 1800) 06-Jan-15 17:21  magnesium oxide 400 mg oral tablet 1 tab(s) orally every 12 hours (0900, 2100) 06-Jan-15 17:21  Acetaminophen/butalbital/caffeine 325 mg-50 mg-40 mg oral tablet 1 tab(s) orally every 4 hours, As Needed - for Headache 06-Jan-15 17:21  guaiFENesin 100 mg/5 mL oral liquid 10 milliliter(s) orally every 4 hours, As Needed for cough 06-Jan-15 17:21  Pepto-Bismol 262 mg/15 mL oral suspension 30 milliliter(s) orally every hour, As Needed - for Indigestion, Heartburn 06-Jan-15 17:21  lactulose 10 g/15 mL oral syrup 15 milliliter(s) orally 3 times a day, As Needed - for Constipation 06-Jan-15 17:21  polyethylene glycol 3350 - oral powder for reconstitution 17 gram(s) mixed in 8 ounces of fluid orally once a day, As Needed - for Constipation 06-Jan-15 17:21  haloperidol 5 mg oral tablet 3 tab(s) (15 mg) orally once a day (at bedtime) (2100) 06-Jan-15 17:21  ziprasidone 80 mg oral capsule 1 cap(s) orally 2 times a day (0900, 2100) 06-Jan-15 17:21  ziprasidone 20 mg oral capsule 1 cap(s) orally 2 times a day (0900, 2100) 06-Jan-15 17:21  haloperidol 5 mg oral tablet 2 tab(s) (10 mg) orally once a day (in the morning) (0900)  06-Jan-15 17:21  Spiriva 18 mcg inhalation capsule 1 cap(s) inhaled once a day (0900) 06-Jan-15 17:21  Levaquin 750 mg oral tablet 1 tab(s) orally once a day (0900) 06-Jan-15 17:21  buPROPion 300 mg/24 hours oral tablet, extended release 1 tab(s) orally once a day (0900) 06-Jan-15 17:21  chlorthalidone 25 mg oral tablet 0.5 tab(s) (12.5 mg) orally once a day (0900) 06-Jan-15 17:21  Colace sodium 100 mg oral capsule 1 cap(s) orally 2 times a day (0900, 1800) 06-Jan-15 17:21  omeprazole 20 mg  oral delayed release tablet 1 tab(s) orally once a day (in the morning) (0630) 06-Jan-15 17:21  Vitamin D3 1000 intl units oral tablet 1 tab(s) orally once a day (0900) 06-Jan-15 17:21  doxycycline hyclate 100 mg oral tablet 1 tab(s) orally 2 times a day for 7 days (0900, 1800) 06-Jan-15 17:21   Allergies:  Risperdal: Rash  Latex: Rash  Zyprexa: Alt Ment Status  Allergies:  Allergies as above   Social/Family History: Employment Status: disabled  Lives With: alone  Living Arrangements: extended care facility  Social History: no tob, no Etoh, no illicits  Family History: no seizure   Vital Signs: **Vital Signs.:   07-Jan-15 21:26  Vital Signs Type Routine  Temperature Temperature (F) 98.2  Celsius 36.7  Pulse Pulse 87  Respirations Respirations 19  Systolic BP Systolic BP 703  Diastolic BP (mmHg) Diastolic BP (mmHg) 76  Mean BP 106  Pulse Ox % Pulse Ox % 95  Pulse Ox Activity Level  At rest  Oxygen Delivery Room Air/ 21 %   Physical Exam: General: normal weight, NAD  HEENT: normoephalic, sclera nonicteric, oropharynx clear but poor dentition  Neck: supple, no JVD, no bruits  Chest: CTA B, no wheezing, good movement  Cardiac: RRR, no murmurs, no edema, 2+ pulses  Extremities: no C/C/E, FROM   Neurologic Exam: Mental Status: confused and oriented only to person, intermittently follows, mild echolia, mild dysarthria  Cranial Nerves: PERRLA, EOMI, nl VF, face symmetric, tongue midline, shoulder shrug equal  Motor Exam: 5/5 B, normal tone, no tremor  Deep Tendon Reflexes: 1+/4 B, downgoing plantars  Sensory Exam: intact to light touch  Coordination: unable to understand commands   Lab Results: Thyroid:  07-Jan-15 04:06   Thyroid Stimulating Hormone 1.48 (0.45-4.50 (International Unit)  ----------------------- Pregnant patients have  different reference  ranges for TSH:  - - - - - - - - - -  Pregnant, first trimetser:  0.36 - 2.50 uIU/mL)  Hepatic:  06-Jan-15  17:14   Bilirubin, Total 0.3  Alkaline Phosphatase  133 (45-117 NOTE: New Reference Range 12/26/12)  SGPT (ALT) 13  SGOT (AST)  14  Total Protein, Serum 7.3  Albumin, Serum 3.4  TDMs:  06-Jan-15 17:14   Lithium, Serum  < 0.20 (0.60-1.20 TOXIC >= 1.50 mmol/L)  Routine Chem:  06-Jan-15 17:14   Ethanol, S. < 3  Ethanol % (comp) < 0.003 (Result(s) reported on 10 Feb 2013 at 06:03PM.)  Phosphorus, Serum 2.8 (Result(s) reported on 10 Feb 2013 at 06:03PM.)  07-Jan-15 04:06   Glucose, Serum 84  BUN 15  Creatinine (comp) 0.90  Sodium, Serum  130  Potassium, Serum  3.3  Chloride, Serum 98  CO2, Serum 27  Calcium (Total), Serum 8.7  Anion Gap  5  Osmolality (calc) 261  eGFR (African American) >60  eGFR (Non-African American) >60 (eGFR values <79m/min/1.73 m2 may be an indication of chronic kidney disease (CKD). Calculated eGFR is useful in patients with stable renal function.  The eGFR calculation will not be reliable in acutely ill patients when serum creatinine is changing rapidly. It is not useful in  patients on dialysis. The eGFR calculation may not be applicable to patients at the low and high extremes of body sizes, pregnant women, and vegetarians.)  Magnesium, Serum  1.5 (1.8-2.4 THERAPEUTIC RANGE: 4-7 mg/dL TOXIC: > 10 mg/dL  -----------------------)  Hemoglobin A1c ([facility])  6.4 (The American Diabetes Association recommends that a primary goal of therapy should be <7% and that physicians should reevaluate the treatment regimen in patients with HbA1c values consistently >8%.)  Cholesterol, Serum 60  Triglycerides, Serum 102  HDL (INHOUSE)  18  VLDL Cholesterol Calculated 20  LDL Cholesterol Calculated 22 (Result(s) reported on 11 Feb 2013 at 05:33AM.)  Urine Drugs:  [id number] 52:77   Tricyclic Antidepressant, Ur Qual (comp) NEGATIVE (Result(s) reported on 10 Feb 2013 at 05:55PM.)  Amphetamines, Urine Qual. NEGATIVE  MDMA, Urine Qual. POSITIVE  Cocaine Metabolite,  Urine Qual. NEGATIVE  Opiate, Urine qual POSITIVE  Phencyclidine, Urine Qual. NEGATIVE  Cannabinoid, Urine Qual. NEGATIVE  Barbiturates, Urine Qual. NEGATIVE  Benzodiazepine, Urine Qual. NEGATIVE (----------------- The URINE DRUG SCREEN provides only a preliminary, unconfirmed analytical test result and should not be used for non-medical  purposes.  Clinical consideration and professional judgment should be  applied to any positive drug screen result due to possible interfering substances.  A more specific alternate chemical method must be used in order to obtain a confirmed analytical result.  Gas chromatography/mass spectrometry (GC/MS) is the preferred confirmatory method.)  Methadone, Urine Qual. NEGATIVE  Routine UA:  06-Jan-15 17:14   Color (UA) Yellow  Clarity (UA) Clear  Glucose (UA) 50 mg/dL  Bilirubin (UA) Negative  Ketones (UA) Negative  Specific Gravity (UA) 1.015  Blood (UA) Negative  pH (UA) 5.0  Protein (UA) Negative  Nitrite (UA) Negative  Leukocyte Esterase (UA) Negative (Result(s) reported on 10 Feb 2013 at 06:41PM.)  RBC (UA) 2 /HPF  WBC (UA) <1 /HPF  Bacteria (UA) NONE SEEN  Epithelial Cells (UA) 1 /HPF (Result(s) reported on 10 Feb 2013 at 06:41PM.)  Routine Hem:  07-Jan-15 04:06   WBC (CBC) 5.1  RBC (CBC)  2.54  Hemoglobin (CBC)  8.4  Hematocrit (CBC)  23.8  Platelet Count (CBC) 164  MCV 93  MCH 33.2  MCHC 35.5  RDW 14.5  Neutrophil % 74.3  Lymphocyte % 9.0  Monocyte % 15.0  Eosinophil % 1.2  Basophil % 0.5  Neutrophil # 3.8  Lymphocyte #  0.5  Monocyte # 0.8  Eosinophil # 0.1  Basophil # 0.0 (Result(s) reported on 11 Feb 2013 at 05:19AM.)   Radiology Results: MRI:    07-Jan-15 10:51, MRI Brain Without Contrast  MRI Brain Without Contrast   REASON FOR EXAM:    seizure  COMMENTS:       PROCEDURE: MR  - MR BRAIN WO CONTRAST  - Feb 11 2013 10:51AM     CLINICAL DATA:  Seizure    EXAM:  MRI HEAD WITHOUT  CONTRAST    TECHNIQUE:  Multiplanar, multiecho pulse sequences of the brain and surrounding  structures were obtained without intravenous contrast.    COMPARISON:  Head CT 02/10/2013.  MRI 02/21/2011.  FINDINGS:  Diffusion imaging does not show any acute or subacute infarction.  The study suffers from considerable motion degradation. The  brainstem and cerebellum are unremarkable. There chronic  small-vessel ischemic changes affecting the cerebral hemispheric  deep and subcortical white matter. No sign  of mass lesion,  hemorrhage, hydrocephalus or extra-axial collection. No mesial  temporal lobe abnormality is visible. No pituitary mass. No skull or  skullbase abnormality. No inflammatory sinus disease.     IMPRESSION:  No cause of seizure identified. No acute finding. Chronic  small-vessel ischemic changes affecting the cerebral hemispheric  white matter.  Electronically Signed    By: Nelson Chimes M.D.    On: 02/11/2013 10:55         Verified By: Jules Schick, M.D.,  CT:    06-Jan-15 17:26, CT Head Without Contrast  CT Head Without Contrast   REASON FOR EXAM:    seizure  COMMENTS:   May transport without cardiac monitor    PROCEDURE: CT  - CT HEAD WITHOUT CONTRAST  - Feb 10 2013  5:26PM     CLINICAL DATA:  First-time seizure.    EXAM:  CT HEAD WITHOUT CONTRAST    TECHNIQUE:  Contiguous axial images were obtained from the base of the skull  through the vertex without intravenous contrast.    COMPARISON:  01/30/2013 CT.  02/21/2011 MR.  FINDINGS:  No intracranial hemorrhage.    Prominent nonspecific white matter type changes possibly related to  result of small vessel disease without CT evidence of large acute  infarct.    Mild atrophy without hydrocephalus.    No intracranial mass lesion noted on this unenhanced exam. If  further delineation for seizure focus is clinically desired,  contrast enhanced MR imaging may be considered.    Vascular  calcifications.   IMPRESSION:  No intracranial hemorrhage.    Prominent nonspecific white matter type changes possibly related to  result of small vessel disease without CT evidence of large acute  infarct.    Mild atrophy without hydrocephalus.    No intracranial mass lesion noted on this unenhanced exam. If  further delineation for seizure focus is clinically desired,  contrast enhanced MR imaging may be considered.      Electronically Signed    By: Aletha Halim.D.    On: 02/10/2013 17:36         Verified By: Doug Sou, M.D.,   Radiology Impression: Radiology Impression: MRI of brain reviewed by me and is poor quality but shows no acute infarct   Impression/Recommendations: Recommendations:   labs reviewed by me notes reviewed by me   Seizure-  this appears to be provoked by psychiatric medications and hyponatremia;  highly doubt underlying seizure disorder Encephalopathy-  this appears to be post-ictal but chronic confusion could be related to psychiatric medications as well Schizophrenia-  appears stable EEG pending wean Keppra tomorrow if EEG neg decrease Bupropion to 140m daily would wean Zyprexa as well keep Mg > 2, Ca > 8 and Na > 130 no restrictions for now will follow results  Electronic Signatures: SJamison Neighbor(MD)  (Signed 07-Jan-15 23:12)  Authored: REFERRING PHYSICIAN, Primary Care Physician, Consult, History of Present Illness, Review of Systems, PAST MEDICAL/SURGICAL HISTORY, HOME MEDICATIONS, ALLERGIES, Social/Family History, NURSING VITAL SIGNS, Physical Exam-, LAB RESULTS, RADIOLOGY RESULTS, Recommendations   Last Updated: 07-Jan-15 23:12 by SJamison Neighbor(MD)

## 2014-05-29 NOTE — Consult Note (Signed)
Psychiatry: Followup for this patient with schizophrenia currently in the hospital because of recurrent hyponatremic. On interview today the patient was asleep when I came by at around 5 PM. She woke up but appeared to be a little bit confused. At first she did not know where she was. She made eye contact and her affect was a little bit blunted. She denied any suicidal ideation and said that her mood is feeling good denied hallucinations and did not appear to be responding to internal stimuli. She remained somewhat confused although she made an effort to have polite conversation and then went back to sleep. still having some intermittent delirium related to her hyponatremic and medical problems. Would not change current psychiatric medicines unless it is thought to be crucial to affecting the hyponatremia I will continue to follow.  Electronic Signatures: Gonzella Lex (MD)  (Signed on 22-Jan-15 18:32)  Authored  Last Updated: 22-Jan-15 18:32 by Gonzella Lex (MD)

## 2014-05-29 NOTE — Discharge Summary (Signed)
PATIENT NAME:  Grace Mcguire, Grace Mcguire MR#:  [id number] DATE OF BIRTH:  April 12, 1955  DATE OF ADMISSION:  02/25/2013 DATE OF DISCHARGE:  02/27/2013   DISCHARGE DIAGNOSES: 1.  Hyponatremia. 2.  Schizophrenia.  3.  Lung cancer, in remission.  4.  Urinary tract infection. 5.  Diabetes mellitus. 6.  Chronic obstructive pulmonary disease.  DISCHARGE MEDICATIONS:  1.  Tolvaptan ([place]) 15 mg daily x 3 days. 2.  Ciprofloxacin 500 mg b.i.d. x 3 days. 3.  Benztropine 2 mg b.i.d. 4.  Ramipril 10 mg daily.  5.  Januvia 50 mg daily.  6.  Metformin 1000 mg b.i.d. 7.  Carvedilol 25 mg b.i.d. 8.  Donepezil 10 mg once a day. 9.  Atorvastatin 10 mg at bedtime. 10.  Ondansetron 4 mg p.o. every 6 hours.  11.  Lactulose 10 grams 3 times a day p.r.n. constipation.  12.  MiraLax 17 grams daily p.r.n.  13.  Ziprasidone 80 mg b.i.d.  14.  Ziprasidone 20 mg b.i.d. with ziprasidone 80 mg. 15.  Spiriva 18 mcg daily.  16.  Chlorthalidone 25 mg 1/2 tab daily.  17.  Colace 100 mg b.i.d.  18.  Vitamin D3, 1000 daily.  19.  Cyanocobalamin  1000 mcg weekly.  20.  Acetaminophen/butalbital/caffeine 325/50/40, 1 tablet p.r.n. every 4 hours for headache. 21.  Guaifenesin 10 mL every 4 hours.   22.  Pepto-Bismol 30 mL p.r.n.   23.  Bupropion 2 mg q.24.  24.  Omeprazole 20 mg daily. 25.  Ativan 0.5 mg every 8 hours p.r.n.  26.  Haloperidol 10 mg daily.  27.  Magnesium oxide 400 mg 3 times a day.   ADMITTING PHYSICIAN: Dr. Laurin Coder.   DISCHARGING PHYSICIAN: Dr. Elijio Miles.   PROCEDURES: None.   IMAGING: Chest x-ray 02/25/2013: Notable for  emphysema.   HOSPITAL COURSE: This lady was admitted to the hospital after being involuntarily committed for deterioration of her schizophrenia. Please refer to the history and physical for full details. In the ED, the patient was deemed not to require involuntary commitment. She had her basic metabolic panel done, which revealed hyponatremia of 130. This is actually stable at  baseline for her. However, the ED physicians decided on infusing 2 liters of normal saline with drop in her sodium to 126 on recheck, and she was subsequently admitted for management of that. It has been recommended that patient'Rozalyn Osland sodium be kept above 130 to minimize the risk of seizures. The patient was admitted to the fluid and placed on fluid restriction. Her sodium remained stable at 126. We then added tolvaptan, which resulted in improvement in her sodium to 132. The patient remained fully awake, alert throughout her hospital stay. She did not exhibit any seizures. She was placed on a fluid restriction of 1500 mL per day. The patient stated she did not want to return to nursing home and preferred to be discharged back to her group home. At this time, disposition is still uncertain.   DISCHARGE INSTRUCTIONS:   DIET: ADA diet. No sodium restrictions.   ACTIVITY: As tolerated.   FOLLOWUP: In 1 to 2 weeks with Dr. Elijio Miles if the patient is discharged to group home. If discharged to nursing home,  in 1 to 2 weeks after discharge from skilled nursing facility.   DISCHARGE PROCESS TIME: 35 minutes.   ____________________________ Venetia Maxon Elijio Miles, MD sat:jcm D: 02/27/2013 13:39:43 ET T: 02/27/2013 13:59:27 ET JOB#: [id number]  cc: Sheikh A. Elijio Miles, MD, <Dictator> Veverly Fells MD ELECTRONICALLY SIGNED 03/08/2013 12:34

## 2014-05-29 NOTE — Consult Note (Signed)
PATIENT NAME:  Grace Mcguire, Grace Mcguire MR#:  [id number] DATE OF BIRTH:  04-Jan-1956  DATE OF CONSULTATION:  02/25/2013  REFERRING PHYSICIAN:   CONSULTING PHYSICIAN:  Gonzella Lex, MD  IDENTIFYING INFORMATION AND REASON FOR CONSULT: A 59 year old woman with a long-standing history of schizophrenia and more recently serious medical problems, who was sent here from her rehab facility. Consultation is because of agitated behavior.   HISTORY OF PRESENT ILLNESS: Information obtained from the patient and the chart. The patient herself, when I found her, could not give me a clear explanation for why she was here. She denied having any suicidal or homicidal ideation. Denied that she was having any hallucinations. She was fixated on her medical problems and was wanting to discuss whether or not she needed to give a stool sample. Eventually, I was able to get her off of this topic. The patient was not agitated or threatening to me. According to the nursing intake, the patient was referred here because when she saw her regular psychiatrist yesterday she was thought to be unstable, possibly having some threatening behavior at home. She has had a complicated medical course recently. She had been admitted to [facility] medically on December 26th with a pneumonia. When she went back home, she had a seizure at the rehab facility which was thought to be related to hyponatremia. Because of that, her Haldol dose was decreased, causing her psychosis to get worse. Yesterday, her primary psychiatrist gave her a Haldol decanoate injection and increased her Haldol once again.   PAST PSYCHIATRIC HISTORY: The patient has had multiple hospitalizations over the years for schizophrenia. When she decompensates, she becomes mute and catatonic or very disorganized and bizarre. Has had episodes of poor self-care in the past. Things were particularly bad before she finally got placed in an assisted living facility.   PAST  MEDICAL HISTORY: The patient was diagnosed with lung cancer within the last year. She has received treatment for that. She also recently had a pneumonia that she is still recovering from. She has diabetes, high blood pressure, vitamin B12 deficiency, COPD.  She is on a very long list of medications.   SOCIAL HISTORY: The patient is currently residing at [facility], a rehab facility here in town. She has ACT team followup for her psychiatric treatment. Her daughter is her closest relative and takes close watch of her.  I am not sure whether she is actually her guardian or not.   FAMILY HISTORY: Unknown.   SUBSTANCE ABUSE HISTORY: I am not sure if she has had issues in the past but no relevance to her current treatment.   REVIEW OF SYSTEMS: The patient did not really have any specific complaints. She was not complaining of hallucinations or of mood problems, was not complaining of any specific physical symptoms. Earlier, when she first came in, it sounds like she was complaining of being nauseous and dizzy but now that seems to have resolved.   CURRENT MEDICATIONS: Wellbutrin 150 mg per day, vitamin B12 1000 mcg injectable once a week, benztropine 2 mg twice a day, ramipril 10 mg once a day, Januvia 50 mg once a day, metformin 1000 mg twice a day, carvedilol 25 mg twice a day, magnesium 400 mg twice a day, Fioricet p.r.n. for headache, guaifenesin liquid as needed for cough, Pepto-Bismol as needed for GI symptoms, chlorthalidone 25 mg 1/2 tablet once a day, omeprazole 20 mg once a day, vitamin D3 1000 units once a day, benazepril 10  mg once a day, atorvastatin 10 mg once a day, ondansetron 4 mg p.o. p.r.n. nausea, lactulose 15 mL 3 times a day, polyethylene glycol as needed every day, Geodon 80 mg twice a day plus 20 mg twice a day for a total of 100 mg twice a day, haloperidol 10 mg in the morning and it sounds like she is also being started back on 15 mg at night and got a 100 mg Haldol Decanoate shot  yesterday, Spiriva HandiHaler 18 mcg inhaled per day.   MENTAL STATUS EXAMINATION: Slightly disheveled, chronically ill-looking woman, looks older than her stated age. Somewhat cooperative with the interview. Eye contact intermittent. Psychomotor activity a little fidgety. Speech normal tone. Thoughts tend to ramble a bit but she is able to answer questions. She is oriented to the fact that she is in the hospital and was oriented to the year but did not know what town or hospital she was in. Did not do memory testing but she could not give any information about any recent events or her referral to the hospital. Denies suicidal or homicidal ideation. Denies having hallucinations. Judgment and insight chronically impaired. Cognitively impaired, probably with dementia gradually getting worse.   LABORATORY RESULTS: The most notable laboratory results from this hospital stay were that when she came in yesterday, her sodium level was 121. AST is very low at 12, total protein and albumin are chronically low. TSH somewhat elevated. Magnesium very low at 1.3. She was given 2 bags of normal saline and a repeat test of her sodium shows it at 130 this morning. Drug screen positive for MDMA which is probably from the Wellbutrin.   ASSESSMENT: A 59 year old woman with schizophrenia. She was sent here because she had been more agitated and confused. By the time I am seeing her today, she appears to me to be back at her baseline of chronic thought disorder combined with dementia but is not aggressive or acutely delirious. My interpretation of this would be that she was delirious yesterday, probably from her hyponatremia on top of her other medical problems, and that that has been resolved after getting some fluid and eating something and getting some rest. She now appears to be back to her baseline and does not require psychiatric hospitalization.   TREATMENT PLAN: I have discussed the case with the Emergency Room doctor. I  will take the patient off of involuntary commitment. She does not need psychiatric treatment. She can be referred to [facility] again or be treated by the medical service as they see fit. No change to her psychiatric medicine.   DIAGNOSIS, PRINCIPAL AND PRIMARY:  AXIS I: Delirium, resolved.   SECONDARY DIAGNOSES AXIS 1:   1.  Schizophrenia, undifferentiated.  2.  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  AXIS II: Deferred.  AXIS III: History of lung cancer, recent pneumonia, chronic obstructive pulmonary disease, diabetes, high blood pressure, hyponatremia, liver insufficiency.  AXIS IV: Severe from multiple medical problems and chronic illness.  AXIS V: Functioning at time of evaluation is 56.   ____________________________ Gonzella Lex, MD jtc:cs D: 02/25/2013 17:53:46 ET T: 02/25/2013 18:44:56 ET JOB#: [id number]  cc: Gonzella Lex, MD, <Dictator> Gonzella Lex MD ELECTRONICALLY SIGNED 02/25/2013 23:15

## 2014-05-29 NOTE — Discharge Summary (Signed)
PATIENT NAME:  Grace Mcguire, SOBECKI MR#:  [id number] DATE OF BIRTH:  February 17, 1955  DATE OF ADMISSION:  01/30/2013 DATE OF DISCHARGE:  02/04/2013   ADMITTING PHYSICIAN:  Belia Heman. Verdell Carmine, MD  DISCHARGING PHYSICIAN: Venetia Maxon. Elijio Miles, MD  DISCHARGE DIAGNOSES:  1. Pneumonia. 2. Acute kidney injury.  3. Generalized weakness and falls. 4. History of schizophrenia.  5. History of dementia.  6. Recent history of lung cancer.  7. Diabetes mellitus type 2.  8. Hypertension.   PROCEDURES: None.   IMAGING: Chest x-ray, CT scan of the head and cervical spine negative for acute intracranial abnormalities or fracture.   HOSPITAL COURSE: This lady was admitted through the Emergency Room, where she presented with recurrent falls and generalized debility. Her workup there revealed a lobar pneumonia and also acute kidney injury. Please refer to the history and physical for full details. The patient was admitted to the medical floor. Her renal failure was managed with high rate intravenous fluids, with normalization of her creatinine and adequate urine output. The patient was treated for nosocomial pneumonia because she lives in a group home. She was placed on vancomycin and Zosyn. The patient's leukocytosis resolved. Her dyspnea also resolved, and her oxygen requirements were room air at the time of discharge. The patient remained physically debilitated; however, she underwent physical therapy, and she was scaled to a rehab facility before transfer to her group home. The patient also had continued chronic constipation. On the day of discharge, we are treating her with a Fleet enema and MiraLax. The patient is already on several laxatives for chronic constipation. The patient's hospital stay was otherwise uncomplicated. She is being discharged to a skilled nursing facility in satisfactory condition.   DISCHARGE MEDICATIONS:  1. Omeprazole 20 mg daily.  2. Vitamin D3 1000 units daily.  3. Benztropine 2 mg b.i.d.   4. Colace 100 mg b.i.d.  5. Ramipril 10 mg daily.  6. Januvia 50 mg daily.  7. Metformin 1000 mg b.i.d. 8. Carvedilol 25 mg b.i.d. 9. Donepezil 10 mg at bedtime.  10. Atorvastatin 10 mg at bedtime.  11. Bupropion 300 mg extended release daily.  12. Ondansetron 4 mg p.r.n. every 6 hours.  13. Acetaminophen/butalbital/caffeine 325/50/40 p.r.n. every 4 to 6 hours.  14. Lactulose 15 mL t.i.d. p.r.n.  15. MiraLax 17 grams daily p.r.n.  16. Ziprasidone 80 mg b.i.d.   17. Ziprasidone 20 mg 1 cap b.i.d.  18. Chlorthalidone 25 mg half-tablet daily.  19. Haloperidol 5 mg 2 tabs daily.  20. Spiriva 18 mcg daily.  21. Triamcinolone cream b.i.d. to affected area.  22. Haldol injectable 1 mL every 4 weeks.  23. Tussin 2 to 4 teaspoons every 4 hours p.r.n.  24. Pepto-Bismol 2 tablespoons q.1-2 hours p.r.n. 25. Haloperidol 5 mg 3 tablets at bedtime.  26. Doxycycline 100 mg b.i.d. for 7 days.  27. Levaquin 750 mg p.o. for 1 day only.   DISCHARGE INSTRUCTIONS:  DIET: Low sodium.   ACTIVITY: As tolerated.   FOLLOWUP: Follow up with nursing home physician. Then, follow up with Dr. Elijio Miles 2 weeks after discharge from skilled nursing facility.   DISCHARGE PROCESS TIME SPENT: 35 minutes.   ____________________________ Venetia Maxon Elijio Miles, MD sat:lb D: 02/04/2013 09:31:15 ET T: 02/04/2013 11:18:50 ET JOB#: [id number]  cc: Sheikh A. Elijio Miles, MD, <Dictator> Veverly Fells MD ELECTRONICALLY SIGNED 02/14/2013 10:34

## 2014-05-29 NOTE — Discharge Summary (Signed)
PATIENT NAME:  Grace Mcguire, Grace Mcguire MR#:  [id number] DATE OF BIRTH:  27-May-1955  DATE OF ADMISSION:  02/10/2013 DATE OF DISCHARGE:  02/13/2013.   ADMITTING PHYSICIAN:  Dr. Bridgett Larsson.  DISCHARGE PHYSICIAN:  Dr. Elijio Miles.   PROCEDURES:  Electroencephalogram, 02/12/2013:  No epileptiform focus.   IMAGING:  MRI of the brain:  No evidence of metastasis.  CONSULTATION:  Neurology, Dr. Valora Corporal.   DISCHARGE MEDICATIONS:  Please refer to medical reconciliation that was reviewed and completed by me. Of note, bupropion reduced from 300 to 150 mg once a day.   HOSPITAL COURSE:  This lady was admitted through the Emergency Room where she presented with a history of a witnessed seizure at her skilled nursing facility. Please refer to the history and physical for full details. The patient was quite lethargic following that seizure, exhibiting a prolonged postictal confusional state. The patient was admitted to medical bed. She was placed on intravenous Keppra. She did not exhibit any further seizures throughout her stay. She started arousing from her confusional state around 24 hours after admission and was fully lucid 48 hours following admission. A Neurology consultation was placed with Dr. Jayme Cloud, who attributed her seizures to her hyponatremia, hypomagnesemia and her psychotropic medications. His recommendation was to lower the dose of her bupropion initially and then subsequently reduced the doses of olanzapine and other psychotropic medications. He also recommended to maintain her sodium of 130 and magnesium above 2. In the hospital, the patient'Grace Mcguire sodium was about 126. She received intravenous normal saline and fluid restriction resulting in her sodium peaking at 133 prior to discharge. The patient'Grace Mcguire other comorbidities remained well controlled. She will be discharged back to skilled nursing facility in stable condition.   DISCHARGE INSTRUCTIONS: 1.  Diet:  ADA diet, low fat, low sodium.  2.  Activity: As  tolerated.   3.  Followup:  Dr. Elijio Miles 2 to 4 weeks after discharge from skilled nursing facility. If patient develops any further seizures can follow up with Neurology as an outpatient.   DISCHARGE PROCESS TIME SPENT:  35 minutes.  ____________________________ Venetia Maxon Elijio Miles, MD sat:jm D: 02/13/2013 13:48:47 ET T: 02/13/2013 14:33:04 ET JOB#: [id number]  cc: Sheikh A. Elijio Miles, MD, <Dictator> Veverly Fells MD ELECTRONICALLY SIGNED 02/14/2013 10:35

## 2014-05-29 NOTE — H&P (Signed)
PATIENT NAME:  Grace Mcguire, Grace Mcguire MR#:  [id number] DATE OF BIRTH:  November 14, 1955  DATE OF ADMISSION:  01/30/2013  PRIMARY CARE PHYSICIAN: Dr. Elijio Miles  CHIEF COMPLAINT: Generalized weakness and falls.   HISTORY OF PRESENT ILLNESS: This is a 59 year old female who presents to the hospital due to generalized weakness, falls and feeling dizzy. The patient resides at assisted-living. The patient herself is a poor historian, therefore most of the history obtained from the daughter at bedside. As per the daughter, the patient has been more weak than usual at the assisted living. She took her mother to her house yesterday and her mother was still feeling pretty weak but was able to eat a meal at home. She has a cough but has been chronic in nature. She has had no documented fever. No nausea. No vomiting. She does complain of abdominal bloating and is normally constipated. Because the patient was having frequent falls and feeling weak at assisted living and now feels dizzy this morning, she was brought to the ER for further evaluation. In the Emergency Room at triage, the patient was noted to be hypotensive with systolic blood pressures in the 70s. She underwent a chest x-ray which showed a right upper lobe infiltrate consistent with pneumonia. Hospitalist services were contacted for further treatment and evaluation.   REVIEW OF SYSTEMS:  CONSTITUTIONAL: No documented fever. No weight gain. No weight loss. Positive generalized weakness.  EYES: No blurred or double vision.  ENT: No tinnitus. No postnasal drip. No redness of the oropharynx.  RESPIRATORY: Positive cough, chronic. No wheeze. No hemoptysis. Positive dyspnea.  CARDIOVASCULAR: No chest pain. No orthopnea. No palpitations or syncope.  GASTROINTESTINAL: No nausea. No vomiting or diarrhea. No abdominal pain. No melena or hematochezia.  GENITOURINARY: No dysuria or hematuria.  ENDOCRINE: No polyuria or nocturia, heat or cold intolerance.  HEMATOLOGIC: No  anemia. No bruising. No bleeding.  INTEGUMENTARY: No rashes. No lesions.  MUSCULOSKELETAL: No arthritis. No swelling. No gout.  NEUROLOGIC: No numbness. No tingling. No ataxia. No seizure-type activity.  NEUROLOGIC, PSYCHIATRIC: Positive schizophrenia. No anxiety. No ADD.   PAST MEDICAL HISTORY: Consistent with dementia, schizophrenia, COPD, hypertension, hyperlipidemia, chronic anemia.   ALLERGIES: RISPERDAL AND ZYPREXA.   SOCIAL HISTORY: Does have a long history of smoking about 40 pack-years. No alcohol abuse. No illicit drug abuse. Lives at assisted-living.   FAMILY HISTORY: Mother and father are both deceased. Both died from complications of an MI.  CURRENT MEDICATIONS: As follows: The patient is currently taking Fioricet 1 tab q. 4 to 6 hours as needed for headache. Atorvastatin 10 mg at bedtime, benztropine 2 mg b.i.d., bupropion 300 mg daily, Coreg 25 mg b.i.d., Klonopin 0.5 mg at bedtime, Colace 100 mg b.i.d., Aricept 10 mg at bedtime, iron sulfate 325 mg daily, Haldol 10 mg in the morning and 15 mg at bedtime, Januvia 50 mg daily, lactulose 15 mL t.i.d. as needed, metformin 1000 mg b.i.d., omeprazole 20 mg daily, Zofran 4 mg q. 6 hours as needed, MiraLAX daily. Ramipril 10 mg daily, vitamin D3 1000 international units daily and Risperdal 80 mg daily.   PHYSICAL EXAMINATION: Presently as follows: VITAL SIGNS:  Temperature 97.8, pulse 90, respirations 18, blood pressure 121/68, sats 100% on 2 liters nasal cannula. 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  HEAD, EYES, EARS, NOSE AND THROAT: Atraumatic, normocephalic. Extraocular muscles are intact. Pupils equal and reactive to light. Sclerae anicteric. No conjunctival injection. No pharyngeal erythema.  NECK: Supple. There is no  jugular venous distention. No bruits. No lymphadenopathy or thyromegaly.  HEART: Regular rate and rhythm. No murmurs, rubs or clicks.  LUNGS: Clear to auscultation bilaterally.  No rales. No rhonchi. No wheezes.  ABDOMEN: Soft, flat, nontender, nondistended. Has good bowel sounds. No hepatosplenomegaly appreciated.  EXTREMITIES: No evidence of any cyanosis, clubbing or peripheral edema. Has +2 pedal and radial pulses bilaterally.  NEUROLOGICAL: The patient is alert, awake and oriented x 3 with no focal motor or sensory deficits appreciated bilaterally.  SKIN: Moist and warm with no rashes appreciated.  LYMPHATIC: There is no cervical or axillary lymphadenopathy.   LABORATORY DATA: Shows serum glucose of 135, BUN 16, creatinine 1.4. Sodium 127, potassium 4.4, chloride 96, bicarbonate 23. The patient's troponin is less than 0.02. White cell count 9.6, hemoglobin 10.6, hematocrit 30.9, platelet count 243. Urinalysis shows 2+ leukocyte esterase with 56 white cells and 3+ bacteria.   The patient did have a CT of the head done without contrast which showed no acute intracranial abnormality. The patient also had a CT of the cervical spine without contrast showing no acute abnormality with diffuse degenerative disease. A chest x-ray showing right upper lobe infiltrate consistent with pneumonia. Port-A-Cath in stable position. Stable right apical bolus change.   ASSESSMENT AND PLAN: This is a 59 year old female with a history of dementia, schizophrenia, chronic obstructive pulmonary disease, hypertension, hyperlipidemia, chronic anemia, history of lung cancer who presents to the hospital with generalized weakness, falls from an assisted living and noted to have pneumonia.   PROBLEM: 1.  Pneumonia. This is likely the source of patient's weakness and just not feeling well. I will treat her for a nosocomial pneumonia, as she comes from assisted living. I will give her IV vancomycin and Zosyn. Follow blood and sputum cultures and follow her clinically. She currently is afebrile with normal white cell count. 2.  Acute renal failure. I suspect this is secondary to dehydration and poor p.o.  intake. I will gently hydrate her with IV fluids, follow BUN and creatinine and urine output, hold her ACE inhibitor for now. 3.  Generalized weakness/falls. Questionable if this is related to her acute respiratory illness versus chronic deconditioning. I will get a physical therapy consult to assess her mobility. She may need a higher level of care as compared to assisted living.  4.  Schizophrenia. I will continue her Haldol, Risperdal and her benztropine.  5.  Dementia. Continue Aricept.  6.  Hypertension. The patient's blood pressure has been on the low side on admission; therefore, I will hold her Coreg and ACE inhibitor for now.  7.  Hyperlipidemia. Continue atorvastatin. 8.  Depression/anxiety. Continue with Wellbutrin and Klonopin.  9.  Diabetes. Continue with Januvia and place on sliding scale insulin coverage.   CODE STATUS: The patient is a FULL CODE.   TIME SPENT: 50 minutes    ____________________________ Belia Heman. Verdell Carmine, MD vjs:ce D: 01/30/2013 15:28:25 ET T: 01/30/2013 15:46:34 ET JOB#: [id number]  cc: Belia Heman. Verdell Carmine, MD, <Dictator> Henreitta Leber MD ELECTRONICALLY SIGNED 03/04/2013 11:17

## 2014-05-29 NOTE — H&P (Signed)
PATIENT NAME:  Grace Mcguire, Grace Mcguire MR#:  [id number] DATE OF BIRTH:  12/09/1955  DATE OF ADMISSION:  02/10/2013  PRIMARY CARE PHYSICIAN: Dr. Elijio Miles   REFERRING PHYSICIAN:  Dr. Joni Fears   CHIEF COMPLAINT: Seizure today.   HISTORY OF PRESENT ILLNESS: A 59 year old Caucasian female with a history of pneumonia, TIA, dementia was sent from [facility] due to seizure activity. The patient is disoriented and mumbling, unable to provide any information. According to the patient's daughter, the patient was noted to have seizure activity in the nursing home at 3:00 p.m. today. The patient was noticed sitting in the bathroom, mumbling and then had seizure activity. The patient lost consciousness and became confused after recovery, but the  patient's daughter does not know about the details.  The patient was sent to ED for further evaluation. The patient had only one episode of seizure, but no more seizures after sending to the ED.   PAST MEDICAL HISTORY:  Pneumonia last December, TIA, dementia schizophrenia, chronic obstructive pulmonary disease, hypertension, diabetes, hyperlipidemia, chronic anemia and lung cancer last year status post chemotherapy.   SOCIAL HISTORY: Nursing home resident, quit smoking one year ago. The patient has a history of smoking about 40 pack-years. No alcohol drinking or illicit drugs.   FAMILY HISTORY: Mother, father both deceased, both died of complications of MI.    REVIEW OF SYSTEMS:  Unable to obtain at this time.   HOME MEDICATIONS:  1.  Ziprasidone 80 mg p.o. b.i.d., then 20 mg p.o. b.i.d.  2.  Vitamin D3 1000 international units 1 tablet once a day.  3.  Spiriva 18 mcg one cap inhaled once a day.  4.  Ramipril 10 mg p.o. daily.  5.  Polyethylene glycol 3350 17 grams once a day p.r.n.  6.  Pepto-Bismol 262 mg/51m, 30 mL every hours p.r.n.  7.  Zofran 4 mg p.o. every six hours p.r.n.  8.  Omeprazole 20 mg p.o. daily.  9.  Metformin 1000 mg p.o. b.i.d.  10.   Magnesium oxide 400 mg p.o. b.i.d.  11.  Levaquin 750 mg p.o. once a day.  12.  Lactulose 10 grams/50 mL, 15 mL t.i.d. p.r.n.  13.  Januvia 50 mg p.o. daily.  14.  Haldol 5 mg p.o. 3 tablets once a day at bedtime and 5 mg 2 tablets once a day in the morning.  15.  Guaifenesin 100 mg/5 mL, 10 mL every  4 four hours p.r.n. for cough.  16.  Doxycycline 100 mg p.o. b.i.d.  17.  Donepezil 10 mg p.o. at bedtime.  18.  Cyanocobalamin 1000 mg p.o. once a week on Wednesday.  19.  Colace 100 mg p.o. b.i.d.  20.  Chlorthalidone 25 mg 0.5 tablet once a day.  21.  Coreg 25 mg p.o. b.i.d.  22.  Bupropion 300 mg per 24 hours tablets 1 tablet once a day.  23.   Benztropine 2 mg p.o. b.i.d.  24.  Atorvastatin 10 mg p.o. at bedtime.  25.  Acetaminophen/butalbital/caffeine 325 mg/50 mg/40 mg p.o. tablets 1 tablet q. 4 hours p.r.n. for headache.   REVIEW OF SYSTEMS: Unable to obtain.   ALLERGIES: RISPERDAL,  ZYPREXA, LATEX.   PHYSICAL EXAMINATION: VITAL SIGNS: Temperature 98.3, blood pressure 174/70, pulse 106, O2 saturation 96% on room air.  GENERAL: The patient is confused,  does not follow commands, in no acute distress.  HEENT: The patient does not follow commands and did not open her eyes,  but no discharge from ear or  nose, unable to get oral exam.  NECK: Supple. No JVD or carotid bruit. No lymphadenopathy, no thyromegaly.  CARDIOVASCULAR: S1, S2 regular rate and rhythm. No murmurs or gallops.  PULMONARY: Bilateral air entry. No wheezing or rales. No use of accessory muscle to breathe.  ABDOMEN: Soft. Bowel sounds present. No distention or tenderness. No organomegaly.  EXTREMITIES: No edema, clubbing or cyanosis. No calf tenderness. Pedal pulses present.   SKIN: No rash or jaundice.  NEUROLOGIC: The patient cannot follow commands, unable to obtain at this time.   LABORATORY, DIAGNOSTIC AND RADIOLOGIC DATA: WBC 9.9, hemoglobin 10.9, platelets 247, glucose 110, BUN 21, creatinine 1.07, sodium 126,  and potassium 3.4, chloride 90, SGOT 14, SGPT 13, bicarbonate 28. Urinalysis is negative.   EKG showed sinus tachycardia at 107 BPM.   Chest x-ray:  Decreased density of right upper lung opacity.   CAT scan of head: No intracranial mass or lesion noted on this exam.   IMPRESSIONS: 1.  Seizure activity.  2.  Hyponatremia.  3.  Dehydration.  4.  Hypertension.  5.  Hypokalemia.  6.  Diabetes.  7.  Hyperlipidemia.  8.  Anemia.  9.  Dementia.   PLAN OF TREATMENT: 1.  The patient will be admitted to the medical floor. We will start aspiration, seizure, fall precautions.  2.  We will start Keppra 500 mg IV b.i.d. we will get an MRI of brain, neuro checks, neurology consultation.  3.  For hyponatremia, we will start normal saline and follow up BMP.  4.  For diabetes, we will start a sliding scale.  5.  Continue on the patient's home medications.  6. I discussed the patient's condition and plan of treatment with the patient's daughter.  TIME SPENT: About 55 minutes.    ____________________________ Demetrios Loll, MD qc:cc D: 02/10/2013 19:55:12 ET T: 02/10/2013 20:15:20 ET JOB#: [id number]  cc: Demetrios Loll, MD, <Dictator> Demetrios Loll MD ELECTRONICALLY SIGNED 02/16/2013 14:42

## 2014-05-30 NOTE — Consult Note (Signed)
Reason for Visit: This 59 year old Female patient presents to the clinic for initial evaluation of  Lung cancer .   Referred by [facility].  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 or see with history of taking other patient and group home his medicines. Chest x-ray was performed showing a right upper lobe mass. This was confirmed on CT scan. She has got greater than 100-pack-year smoking history. She is having no cough hemoptysis or chest tightness. She is undergone if fine-needle aspirate CT-guided biopsy which was positive for small cell lung cancer. PET/CT showed disease confined to the chest. She is been seen by medical oncology and I been asked to evaluate the patient for possibility of concurrent radiation for limited stage small cell lung cancer. She is accompanied by her daughter today.  Past Hx:    hx of suicide attempts:    iron deficiency anemia:    GERD - Esophageal Reflux:    anemia:    lung mass:    Hypercholesterolemia:    shingles:    HTN:    Diabetes:    Schizophrenia- paranoid:    Cholecystectomy:   Past, Family and Social History:   Past Medical History positive    Cardiovascular hyperlipidemia; hypertension    Gastrointestinal GERD    Endocrine diabetes mellitus    Neurological/Psychiatric Paranoid schizophrenia with history of suicide attempts    Past Surgical History cholecystectomy    Past Medical History Comments Iron deficiency anemia    Family History positive    Family History  Comments CVA and hypertension no family history of cancer    Social History positive    Social History Comments 100-pack-year smoking history    Additional Past Medical and Surgical History Accompanied by daughter today   Allergies:   Risperdal: Rash  Latex: Rash  Zyprexa: Unknown  Home Meds:  Home Medications:  clonazepam 1 mg oral tablet: 1  orally once a day (at bedtime) , Active  Aricept 10 mg oral tablet: 1 tab(s) orally once a day (in the morning), Active  buPROPion 300 mg/24 hours (XL) oral tablet, extended release: 1 tab(s) orally once a day (in the morning), Active  ferrous sulfate 325 mg (65 mg elemental iron) oral tablet: 1 tab(s) orally once a day (in the morning), Active  omeprazole 20 mg oral delayed release capsule: 1 cap(s) orally once a day (in the morning), Active  ramipril 10 mg oral capsule: 1 cap(s) orally once a day (in the morning), Active  Vitamin D3 1000 intl units oral capsule: 1 cap(s) orally once a day (in the morning), Active  atorvastatin 20 mg oral tablet: 1 tab(s) orally once a day (in the evening) at 5pm. **brand name lipitor**, Active  benztropine 2 mg oral tablet: 1 tab(s) orally 2 times a day, Active  docusate sodium 100 mg oral tablet: 1 tab(s) orally 2 times a day, Active  Geodon 80 mg oral capsule: 1 cap(s) orally 2 times a day along with '20mg'$  cap='100mg'$  total., Active  Geodon 20 mg oral capsule: 1  cap(s) orally 2 times a day along with '80mg'$  cap='100mg'$  total, Active  haloperidol 5 mg oral tablet: 3 tabs ('15mg'$ ) orally 2 times a day, Active  metformin 500 mg oral tablet: 1 tab(s) orally 2 times a day, Active  Tylenol Caplet Extra Strength 500 mg oral tablet: 1 to 2 tab(s) orally every 4 to 6 hours as needed., Active  Coreg 6.25 mg oral tablet: 1 tab(s) orally 2 times a day, Active  polyethylene glycol 3350 oral powder for reconstitution: 1 capful in 8 oz water, juice or ther liquid   orally every morning , Active  Haldol: 1 milliliter(s)  intramuscular every 3 weeks, Active  Review of Systems:   General negative    Performance Status (ECOG) 0    Skin negative    Breast negative    Ophthalmologic negative    ENMT negative    Respiratory and Thorax see HPI    Cardiovascular see HPI    Gastrointestinal negative    Genitourinary negative    Musculoskeletal negative    Neurological negative    Psychiatric see HPI    Hematology/Lymphatics negative    Endocrine see HPI    Allergic/Immunologic negative   Physical Exam:  General/Skin/HEENT:   Skin normal    Eyes normal    ENMT normal    Head and Neck normal    Additional PE Well-developed female with obvious evidence of schizophrenia although is communicative. Lungs are clear to A&P cardiac examination shows regular rate and rhythm. No supraclavicular or axillary adenopathy is appreciated. Abdomen is benign.   Breasts/Resp/CV/GI/GU:   Respiratory and Thorax normal    Cardiovascular normal    Gastrointestinal normal    Genitourinary normal   MS/Neuro/Psych/Lymph:   Musculoskeletal normal    Neurological normal    Lymphatics normal   Other Results:  Radiology Results: MRI:    16-Jan-13 12:04, MRI Brain  With/Without Contrast   MRI Brain  With/Without Contrast    REASON FOR EXAM:    rt upper lobe pulmonary mass  COMMENTS:       PROCEDURE: MR  - MR BRAIN WO/W CONTRAST  - Feb 21 2011 12:04PM     RESULT: Comparison: MRI of the brain 03/13/2007    Technique: Standard brain protocol, before and after the administration   of 11 mL Multihance intravenous contrast.    Findings:  There is mild periventricular and subcortical foci of T2 hyperintensity,   which are nonspecific in a patient of this age, but likely sequela of   chronic microangiopathy. There are no areas of restricted diffusion to   suggest acute infarct. No evidence of mass, mass effect, midline shift,     or extra-axial fluid collection. No abnormal areas of enhancement.  The   intracranial flow voids are unremarkable.    There is mild mucosal thickening of the maxillary sinuses. A small amount   of fluid is present in the left mastoid air cells.    IMPRESSION:     1. No acute intracranial findings. No evidence of intracranial metastatic   disease.  2. Mild chronic microangiopathy.  3. Small amount of fluid in left mastoid air cells, which is nonspecific.          Verified By: Gregor Hams, M.D., MD  CT:    03-Jan-13 18:38, CT Chest With Contrast   CT Chest With Contrast    REASON FOR EXAM:    rt lung mass on CXR  COMMENTS:       PROCEDURE:  CT  - CT CHEST WITH CONTRAST  - Feb 08 2011  6:38PM     RESULT: Indication: Right lung mass    Comparison: None    Technique: Multiple axial images of the chest are obtained with 70 mL of   Isovue-370 intravenous contrast.    Findings:    The central airways are patent. There is a 4.7 cm right upper lobe     pulmonary mass. Adjacently and medially in the right suprahilar region   there is a second 2.5 cm right upper lobe nodule. There is right hilar   lymphadenopathy measuring 17 mm in short axis. There is right lower lobe   airspace disease concerning for pneumonia. There are severe bilateral   emphysematous changes. There is left basilar atelectasis.    There are no pathologically enlarged axillary, hilar, or mediastinal   lymph nodes.      The heart size is normal. There is no pericardial effusion. The thoracic   aorta is normal in caliber.  The there is coronary artery atherosclerosis   in the left main coronary artery.    Review of bone windows demonstrates no focal lytic or sclerotic lesions.     Limited noncontrast images of the upper abdomen were obtained. The   adrenal glands appear normal. The remainder of the visualized abdominal   organs are unremarkable.    IMPRESSION:     1. There is a 4.7 cm right upper lobe pulmonary mass. Adjacently,   medially in the right suprahilar  region there is a second 2.5 cm a right   upper lobe nodule. The overall appearance is most concerning for primary   lung malignancy. There is right hilar lymphadenopathy concerning for   nodal metastasis.    2. Right lower lobe airspace disease concerning for pneumonia.        Verified By: Jennette Banker, M.D., MD  Nuclear Med:    15-Jan-13 13:10, PET/CT Scan Lung Cancer Diagnosis   PET/CT Scan Lung Cancer Diagnosis    REASON FOR EXAM:    lung nodule mass  COMMENTS:       PROCEDURE: PET - PET/CT DX LUNG CA  - Feb 20 2011  1:10PM     RESULT: The patient has a fasting blood glucose level of 126 mg/dL. The   patient received an injection of 12.49 mCi of fluorine-18 labeled   fluorodeoxyglucose in the left antecubital region at 11:26 a.m. with   imaging obtained from the base of the brain into the thighs with PET and   CT between the hours of 12:37 p.m. and 1:00 p.m. The low-dose noncontrast   CT of the abdomen andpelvis, attenuation corrected PET images and fused   PET/CT data are reconstructed by the [employer]ia software in the axial,   coronal and sagittal planes for interpretation. The patient has no   previous examination for comparison.    Right lung masses extending from the hilum almost to the anterolateral   right chest wall and abutting the pleural surface demonstrate abnormal   localization with a maximum SUV measuring 11.64 and a mean SUV of 7.30.   This involves the right hilar region. There is diffusely increased   localization within the stomach raising the possibility of gastritis.   Representative area demonstrates a maximum SUV of 4.86 with a mean of   2.85. Abnormal accumulation of tracer is present within the colon in   several sites with one of the more intense being in the rectosigmoid  region demonstrating a maximum SUV of 7.35 with a mean of 4.48. There is   also a small focal area of increased tracer accumulation within the   ascending colon medial posterior  aspect showing amaximum SUV measurement   of 7.46 with a mean of 4.40. No abnormal mesenteric or retroperitoneal   accumulation is evident. The urinary bladder contains a moderate amount   of urine. Atherosclerotic calcification is noted within the aorta.    IMPRESSION:   1. Findings concerning for malignant disease within the right lung   extending to the hilum and abutting the pleural surface of the chest wall.  2. Abnormal localization in focal areas of the rectosigmoid region and   right colon. If the patient has not undergone a recent negative   colonoscopy, then further investigation with colonoscopy would be   recommended.  3. Diffuse increased localization of tracer within the stomach consistent   with gastritis.  4. Atherosclerotic disease.      Thank you for the opportunity to contribute to the care of your patient.         Verified By: Sundra Aland, M.D., MD   Assessment and Plan:   Impression   limited stage small cell lung cancer in 59 year old female with chronic paranoid schizophrenia   Plan   at this time I discussed the case personally with Dr. Oliva Bustard. Like to go ahead with concurrent radiation and chemotherapy. Would plan on delivering 6000 cGy over 6 weeks. I have set her up for CT simulation tomorrow. Risks and benefits of treatment including slight dysphasia cough and skin reaction were discussed with the patient and her daughter. Both seem to comprehend my treatment plan well. We will coordinate chemotherapy with Dr. Oliva Bustard. After completion of all radiation will discuss treating her whole brain to eradicate any microscopic residual disease if she achieves a complete response on restaging after completion of all systemic chemotherapy.  I would like to take this opportunity to thank you for allowing me to continue to participate in this patient's care.  CC Referral:   cc: Dr. Elijio Miles   Electronic Signatures: Armstead Peaks (MD)  (Signed 23-Jan-13  12:35)  Authored: HPI, Diagnosis, Past Hx, PFSH, Allergies, Home Meds, ROS, Physical Exam, Other Results, Encounter Assessment and Plan, CC Referring Physician   Last Updated: 23-Jan-13 12:35 by Armstead Peaks (MD)

## 2014-05-30 NOTE — Op Note (Signed)
PATIENT NAME:  [name], Grace Mcguire MR#:  [id number] DATE OF BIRTH:  1955-11-14  DATE OF PROCEDURE:  03/22/2011  PREOPERATIVE DIAGNOSIS: Cancer of the lung. The patient is here for port insertion.   POSTOPERATIVE DIAGNOSIS: Cancer of the lung. The patient is here for port insertion.  OPERATION: Insertion of Port-A-Cath on the right side with ultrasound guidance and fluoroscopy.   SURGEON:  Talli Kimmer S. Lucresia Simic, MD   INDICATIONS: This patient was quite emaciated and was not eating enough at home. The patient had a cancer of lung on the right side and she was brought in for port insertion. I decided in the beginning to put it in the left side as on the right side the patient was having radiation therapy.  PROCEDURE: The left side of the chest was then prepped and draped and also the neck was then prepped and draped. I tried ultrasound. The patient had very small vein and was very collapsing. I tried two to three attempts, I could not get it, and then I went to the left neck and tried internal jugular and could not get it, and then I tried the other side and in both sides I could not get it. After that I requested Dr. Jamal Collin to come and help me and he tried also on the right side first of the subclavian and could not get it, tried on the neck and could not get it, and then went back on the right side and were able to then get then with ultrasound guidance and fluoroscopy. After we got the blood drawn, the wire was then pulled through the needle into the superior vena cava and after that a dilator was then put on top of it and pushed into it. After that the wire and the dilator was removed and the catheter was then inserted into the sheath and it was in a good position. After that, a small area on the chest wall was then infiltrated with 1% Xylocaine and Marcaine and a pocket was made where the Port-A-Cath was then put in. The catheter was tunneled into the Port-A-Cath. After that, Port-A-Cath was then fixed to the  chest wall with interrupted sutures. After this was performed, subcuticular closure with 4-0 Vicryl was then done.      A chest x-ray was ordered. Chest x-ray report came back negative for pneumothorax. I sucked the Port-A-Cath. It had good blood return. The patient was then sent to the recovery room.   ____________________________ Welford Roche Phylis Bougie, MD [id number] D: 03/22/2011 12:05:49 ET T: 03/22/2011 13:18:16 ET JOB#: [id number]  cc: Barbaraann Avans S. Phylis Bougie, MD, <Dictator> Sharene Butters MD ELECTRONICALLY SIGNED 03/23/2011 9:12

## 2014-05-30 NOTE — H&P (Signed)
PATIENT NAME:  Grace Mcguire, Grace Mcguire MR#:  [id number] DATE OF BIRTH:  07-11-1955  DATE OF ADMISSION:  02/07/2011  PRIMARY CARE PHYSICIAN: Pernell Dupre, MD  CHIEF COMPLAINT: Altered mental status.   HISTORY OF PRESENT ILLNESS: This is a 59 year old female who has a history of schizophrenia. She lives in a group nursing home. It was reported today that she was accidentally given another patients medications. The patient later became sedated. There is report that she did not take her own medications. She was brought here to the hospital. She has been obtunded, although vital signs are stable. Medication included some blood pressure medications and other benign medications, but did also include methadone and Lyrica, which the patient has not taken before, and also some Actos and she is nondiabetic. The patient continues to be obtunded.   PAST MEDICAL HISTORY:  1. Schizophrenia.  2. Dementia.  3. History of diabetes.  4. Anemia.  5. Hypertension.  6. Gastroesophageal reflux disease.  7. Iron deficiency anemia.  8. History of suicide attempts in the past.   ALLERGIES: Risperdal, Zyprexa, and latex.  SOCIAL HISTORY: She lives in a group home.   FAMILY HISTORY: Unable to obtain from the patient because she is obtunded.  REVIEW OF SYSTEMS: Unable to obtain from the patient because she is obtunded.  CURRENT HOME MEDICATIONS:  1. Aricept 10 mg daily.  2. Bupropion 300 mg daily.  3. Ferrous sulfate 325 mg daily.  4. Omeprazole 20 mg daily.  5. Ramipril 10 mg daily.  6. Vitamin D 1000 units every a.m.  7. Lipitor 20 mg daily.  8. Benztropine 2 mg twice a day. 9. Coreg 6.25 mg twice a day. 10. Docusate 100 mg twice a day. 11. Geodon 100 mg twice a day. 12. Haldol 15 mg twice a day. 13. Metformin 500 mg twice a day. 14. Clonazepam 1 mg at bedtime.  15. Tylenol 500 mg 1 to 2 every 4 to 6 hours p.r.n.Marland Kitchen   MEDICATIONS GIVEN INADVERTENTLY: 1. Furosemide 40 mg. 2. Lisinopril 40  mg. 3. Loratadine 10 mg. 4. Magnesium oxide 400 mg. 5. Omeprazole 20 mg. 6. Terbinafine 250 mg. 7. Vitamin D3. 8. Bisoprolol/HCTZ 6.25/10 mg. 9. Methadone 10 mg.  10. Lyrica 150 mg. 11. Actos Plus. 12. Metformin 15 mg.   PHYSICAL EXAMINATION:   VITAL SIGNS: Temperature 97.9, pulse 64, respirations 12, and blood pressure 86/47.   GENERAL: This is a poorly nourished white female who is obtunded.   HEENT: Pupils are pinpoint but equal. Oral mucosa is dry. Oropharynx is clear. Nasopharynx is clear.   NECK: Supple. No JVD, lymphadenopathy, or thyromegaly.   HEART: Regular rate and rhythm. No murmurs, rubs, or gallops.   LUNGS: Clear to auscultation. No dullness to percussion. She is not using accessory muscles.   ABDOMEN: Soft, nontender, and nondistended. Bowel sounds are positive. No hepatosplenomegaly. No masses.   EXTREMITIES: No edema. No joint deformity.   NEUROLOGIC: She is completely obtunded. She does not even withdrawal from pain. There is no Babinski's.   SKIN: Moist with no rash.   LABORATORY DATA: White blood cells 6.5 and hemoglobin 10.3. BUN 9, creatinine 0.84, and sodium 133.   ASSESSMENT AND PLAN:  1. Altered mental status: I suspect this is from medications that she took inadvertently, especially the methadone and Lyrica could certainly make her unresponsive since she has not been taking these type medications before. Methadone has a very long half-life. We will give her supportive care and observe her on a monitor  and do frequent neuro checks. If there is any sign of neurologic deterioration, we will proceed with head CT and neurologic evaluation. Also she has taken some diabetic medication so we will monitor blood sugars frequently to make sure this is not playing into this.  2. Schizophrenia: Again I am holding all her medications for now because she is obtunded. Crisoforo Oxford she is awake, we will restart her usual medications.  3. Hypertension: Again holding  medications and just monitoring for now.   TIME SPENT ON ADMISSION: 45 minutes. ____________________________ Baxter Hire, MD [id number] D: 02/07/2011 13:56:00 ET     T: 02/07/2011 14:31:19 ET        JOB#: [id number] cc: Baxter Hire, MD, <Dictator> Venetia Maxon. Elijio Miles, MD Baxter Hire MD ELECTRONICALLY SIGNED 02/09/2011 10:12

## 2014-05-30 NOTE — Discharge Summary (Signed)
PATIENT NAME:  Grace Mcguire, Grace Mcguire MR#:  [id number] DATE OF BIRTH:  Jun 27, 1955  DATE OF ADMISSION:  02/07/2011 DATE OF DISCHARGE:  02/09/2011  DISCHARGE DIAGNOSES:  1. Unintentional drug overdose.  2. Lung mass.  3. Schizophrenia.  4. History of diabetes mellitus.   DISCHARGE MEDICATIONS: The patient is to resume previous home medications.   DIET: ADA diet.   FOLLOWUP:  1. Follow up with Dr. Elijio Miles in 1 to 2 weeks.  2. Follow up with Dr. Genevive Bi in 1 to 2 weeks at the [facility].   HOSPITAL COURSE: This 59 year old lady was admitted from the Emergency Room after inadvertently receiving medications from assisted living facility that was intended for another patient.  She was quite obtunded at the time of admission. She was admitted for observation. Please see History and Physical for full details. The patient was hydrated. Her mental status returned to normal in less than 24 hours. She was observed for an additional day to arrange discharge to another group home. However, the patient decided to return to her previous group home. Note is made of a lung mass. A Pulmonary consult was obtained. A CT scan of the thorax was performed which confirmed the lung mass, and Oncology follow-up with Dr. Genevive Bi has been arranged. The patient has been discharged back to her group home. Follow up with Dr. Elijio Miles in 1 to 2 weeks, Dr. Genevive Bi in 1 to 2 weeks.   TIME SPENT ON DISCHARGE: Greater than 32 minutes.  ____________________________ Venetia Maxon Elijio Miles, MD sat:cbb D: 02/09/2011 16:41:33 ET T: 02/12/2011 10:33:40 ET JOB#: [id number] Alfredia Ferguson A TEJAN-SIE MD ELECTRONICALLY SIGNED 03/09/2011 12:15

## 2014-05-31 ENCOUNTER — Ambulatory Visit
Admit: 2014-05-31 | Disposition: A | Discharge status: Home / Self Care | Payer: Self-pay | Attending: Oncology | Admitting: Oncology

## 2014-05-31 LAB — CBC CANCER CENTER
BASOS PCT: 0.6 %
Basophil #: 0 x10 3/mm (ref 0.0–0.1)
Eosinophil #: 0.2 x10 3/mm (ref 0.0–0.7)
Eosinophil %: 2.1 %
HCT: 28.6 % — ABNORMAL LOW (ref 35.0–47.0)
HGB: 9.5 g/dL — ABNORMAL LOW (ref 12.0–16.0)
Lymphocyte #: 0.4 x10 3/mm — ABNORMAL LOW (ref 1.0–3.6)
Lymphocyte %: 4.8 %
MCH: 29.5 pg (ref 26.0–34.0)
MCHC: 33.3 g/dL (ref 32.0–36.0)
MCV: 89 fL (ref 80–100)
MONO ABS: 0.7 x10 3/mm (ref 0.2–0.9)
Monocyte %: 9.1 %
Neutrophil #: 6.2 x10 3/mm (ref 1.4–6.5)
Neutrophil %: 83.4 %
Platelet: 302 x10 3/mm (ref 150–440)
RBC: 3.23 10*6/uL — ABNORMAL LOW (ref 3.80–5.20)
RDW: 15.2 % — ABNORMAL HIGH (ref 11.5–14.5)
WBC: 7.4 x10 3/mm (ref 3.6–11.0)

## 2014-05-31 LAB — COMPREHENSIVE METABOLIC PANEL
ANION GAP: 7 (ref 7–16)
Albumin: 4.1 g/dL
Alkaline Phosphatase: 101 U/L
BUN: 18 mg/dL
Bilirubin,Total: 0.4 mg/dL
CALCIUM: 9.7 mg/dL
CHLORIDE: 95 mmol/L — AB
Co2: 24 mmol/L
Creatinine: 0.91 mg/dL
EGFR (Non-African Amer.): >60
GLUCOSE: 139 mg/dL — AB
Potassium: 5.1 mmol/L
SGOT(AST): 13 U/L — ABNORMAL LOW
SGPT (ALT): 8 U/L — ABNORMAL LOW
Sodium: 126 mmol/L — ABNORMAL LOW
Total Protein: 7.1 g/dL

## 2014-06-09 ENCOUNTER — Other Ambulatory Visit: Payer: Self-pay | Admitting: *Deleted

## 2014-06-09 DIAGNOSIS — E871 Hypo-osmolality and hyponatremia: Secondary | ICD-10-CM

## 2014-06-16 ENCOUNTER — Inpatient Hospital Stay: Payer: Medicare Other | Attending: Oncology

## 2014-06-16 ENCOUNTER — Inpatient Hospital Stay (HOSPITAL_BASED_OUTPATIENT_CLINIC_OR_DEPARTMENT_OTHER): Payer: Medicare Other | Admitting: Oncology

## 2014-06-16 VITALS — BP 117/79 | HR 87 | Temp 96.2°F | Wt 121.7 lb

## 2014-06-16 DIAGNOSIS — E119 Type 2 diabetes mellitus without complications: Secondary | ICD-10-CM

## 2014-06-16 DIAGNOSIS — E780 Pure hypercholesterolemia: Secondary | ICD-10-CM

## 2014-06-16 DIAGNOSIS — K219 Gastro-esophageal reflux disease without esophagitis: Secondary | ICD-10-CM

## 2014-06-16 DIAGNOSIS — W19XXXD Unspecified fall, subsequent encounter: Secondary | ICD-10-CM

## 2014-06-16 DIAGNOSIS — Z85118 Personal history of other malignant neoplasm of bronchus and lung: Secondary | ICD-10-CM

## 2014-06-16 DIAGNOSIS — S42351D Displaced comminuted fracture of shaft of humerus, right arm, subsequent encounter for fracture with routine healing: Secondary | ICD-10-CM | POA: Diagnosis not present

## 2014-06-16 DIAGNOSIS — F17210 Nicotine dependence, cigarettes, uncomplicated: Secondary | ICD-10-CM | POA: Insufficient documentation

## 2014-06-16 DIAGNOSIS — M25511 Pain in right shoulder: Secondary | ICD-10-CM | POA: Insufficient documentation

## 2014-06-16 DIAGNOSIS — Z79899 Other long term (current) drug therapy: Secondary | ICD-10-CM | POA: Insufficient documentation

## 2014-06-16 DIAGNOSIS — E871 Hypo-osmolality and hyponatremia: Secondary | ICD-10-CM

## 2014-06-16 DIAGNOSIS — C3490 Malignant neoplasm of unspecified part of unspecified bronchus or lung: Secondary | ICD-10-CM

## 2014-06-16 DIAGNOSIS — Z9223 Personal history of estrogen therapy: Secondary | ICD-10-CM

## 2014-06-16 DIAGNOSIS — Z9221 Personal history of antineoplastic chemotherapy: Secondary | ICD-10-CM | POA: Insufficient documentation

## 2014-06-16 DIAGNOSIS — C3411 Malignant neoplasm of upper lobe, right bronchus or lung: Secondary | ICD-10-CM

## 2014-06-16 LAB — SODIUM: SODIUM: 130 mmol/L — AB (ref 135–145)

## 2014-06-19 ENCOUNTER — Encounter: Payer: Self-pay | Admitting: Oncology

## 2014-06-19 DIAGNOSIS — C3411 Malignant neoplasm of upper lobe, right bronchus or lung: Secondary | ICD-10-CM | POA: Insufficient documentation

## 2014-06-19 NOTE — Progress Notes (Signed)
[place] @ [facility] Telephone:(336) [phone]  Fax:(336) [place]: 01-06-1956  MR#: [id number]  GOT#:[id number]  Patient Care Team: Jodi Marble, MD as PCP - General (Internal Medicine)  CHIEF COMPLAINT:  Chief Complaint  Patient presents with  . Follow-up    Oncology History   1. Abnormal CT scan.  Recently found during hospitalization(tired January, 2013) 2. 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 Start on concurrent radiation and chemotherapy with cis-platinum and VP-16 from March 07, 2011(chemotherapy was delayed to February of 2013) 4. Radiation has been finished in May of 2013 5. Patient has finished 6 cycles of chemotherapy with cis platinum and VP-16 (July of 2013     Primary cancer of right upper lobe of lung   06/19/2014 Initial Diagnosis Primary cancer of right upper lobe of lung    No flowsheet data found.  INTERVAL HISTORY: 59 year old lady ordered a history of small cell undifferentiated carcinoma of lung status post 6 cycles of chemotherapy had a recent fall had broken right humerus.  An MRI scan done at [facility] which revealed the communicated fracture of the right humerus.  There is abnormal angulation of the distal humeral shaft.  Possibility of found infection like osteomyelitis versus metastatic disease cannot be ruled out H and is here for ongoing evaluation and treatment consideration  REVIEW OF SYSTEMS:   Gen. status: Patient is not any acute distress.  HEENT: No evidence of soreness in the mouth.  No difficulty swallowing.  Musculoskeletal system holding right shoulder.  Does not voice complain of pain in the right shoulder but.  Lungs: Dry hacking cough shortness of breath on exertion cardiac: No chest  pain.  GI: No nausea.  No vomiting.  No diarrhea.  Rash.  Neurological system: No headache or dizziness or weakness.  No tingling numbness in lower extremity.  Endocrine system no complaints lower extremity no swelling  As per HPI. Otherwise, a complete review of systems is negatve.  PAST MEDICAL HISTORY:  been reviewed from previous medical records  PAST SURGICAL HISTORY: PFSH: Comments: No family history of colorectal cancer, breast cancer, or ovarian cancer.   Marland Kitchen  History of cerebrovascular accident hypertension in the family  Social History: positive tobacco  Smoker: Smoking cessation counseling performed  Comments: patient lives in a group home.  Divorced.  .  Chronic smoker started smoking since  age of 24.  Does not drink.  Additional Past Medical and Surgical History: history of schizophrenia  and suicidal  attempts    Gastroesophageal reflux disease   Iron deficiency anemia   Hypocholesteremia   Diabetes controlled with oral medication   History of cholecystectomy    GYNECOLOGIC HISTORY:  No LMP recorded.     ADVANCED DIRECTIVES:  Patient does have advanced medical directive  HEALTH MAINTENANCE: History  Substance Use Topics  . Smoking status: Former Smoker    Quit date: 06/18/2013  . Smokeless tobacco: Not on file  . Alcohol Use: Not on file    :  Allergies  Allergen Reactions  . Latex Itching  . Olanzapine Hives  . Risperidone Hives    Current Outpatient Prescriptions  Medication Sig Dispense Refill  . amLODipine-benazepril (LOTREL) 5-20 MG per capsule     .  donepezil (ARICEPT) 10 MG tablet     . haloperidol (HALDOL) 10 MG tablet     . haloperidol decanoate (HALDOL DECANOATE) 100 MG/ML injection     . lactulose (CHRONULAC) 10 GM/15ML solution     . lubiprostone (AMITIZA) 24 MCG capsule     . metFORMIN (GLUCOPHAGE) 850 MG tablet Take by mouth.    . polyethylene glycol powder (GLYCOLAX/MIRALAX) powder     . Tiotropium Bromide Monohydrate 2.5 MCG/ACT AERS      . ziprasidone (GEODON) 20 MG capsule     . atorvastatin (LIPITOR) 10 MG tablet Take by mouth.    . benztropine (COGENTIN) 2 MG tablet Take by mouth.    Marland Kitchen buPROPion (WELLBUTRIN XL) 150 MG 24 hr tablet Take by mouth.    . carvedilol (COREG) 25 MG tablet Take by mouth.    . Cholecalciferol 1000 UNITS tablet Take by mouth.    Marland Kitchen LORazepam (ATIVAN) 0.5 MG tablet Take by mouth.    . magnesium oxide (MAG-OX) 400 MG tablet Take by mouth.    . metFORMIN (GLUCOPHAGE) 1000 MG tablet Take by mouth.    Marland Kitchen omeprazole (PRILOSEC) 20 MG capsule Take by mouth.    . ziprasidone (GEODON) 80 MG capsule Take by mouth.     No current facility-administered medications for this visit.    OBJECTIVE:  Filed Vitals:   06/16/14 1204  BP: 117/79  Pulse: 87  Temp: 96.2 F (35.7 C)     There is no height on file to calculate BMI.    ECOG FS:1 - Symptomatic but completely ambulatory  PHYSICAL EXAM: GENERAL:  Well developed, well nourished, sitting comfortably in the exam room in no acute distress. MENTAL STATUS:  Alert and oriented to person, place and time. HEAD:  Normocephalic, atraumatic, face symmetric, no Cushingoid features. EYES:  .  Pupils equal round and reactive to light and accomodation.  No conjunctivitis or scleral icterus. ENT:  Oropharynx clear without lesion.  Tongue normal. Mucous membranes moist.  RESPIRATORY:  Clear to auscultation without rales, wheezes or rhonchi. CARDIOVASCULAR:  Regular rate and rhythm without murmur, rub or gallop. . ABDOMEN:  Soft, non-tender, with active bowel sounds, and no hepatosplenomegaly.  No masses. BACK:  No CVA tenderness.  No tenderness on percussion of the back or rib cage. SKIN:  No rashes, ulcers or lesions. EXTREMITIES: Right upper extremity.  Tenderness in the right shoulder.  Patient is guarding right upper extremity. LYMPH NODES: No palpable cervical, supraclavicular, axillary or inguinal adenopathy  NEUROLOGICAL: Unremarkable. PSYCH:   Appropriate.   LAB RESULTS:  Appointment on 06/16/2014  Component Date Value Ref Range Status  . Sodium 06/16/2014 130* 135 - 145 mmol/L Final      STUDIES: MRI scan of the right shoulder done at [facility] has been reviewed independently and we will upload MRI scan on our system.  There is significant changes in the right proximal head and distal staff.  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  Has been reviewed.  Independently.  Shows thickening of the right upper lobe area.  Abnormality of right humerus According to patient's he is stop smoking Pain in the right shoulder  MEDICAL DECISION MAKING:  MRI scan has been reviewed.  Orthopedic note has been reviewed.  Orthopedic surgeon suggested conservative therapy at present time.  I discussed with the patient  and family that unless biopsy of done of this area we cannot say whether there is any evidence of malignancy there will not.  Certainly clinical picture may be suggestive of malignancy.  Repeat MRI may be indicated to see there are progressive changes.  Eventually right shoulder may need replacement because of fracture if it fails to heal. Total duration of visit was 45 minutes.  50% or more time was spent in counseling patient and family regarding prognosis and options of treatment and available resources  Patient expressed understanding and was in agreement with this plan. She also understands that She can call clinic at any time with any questions, concerns, or complaints.    Primary cancer of right upper lobe of lung   Staging form: Lung, AJCC 7th Edition     Clinical: Stage IIIA (T3, N2, M0) - Unsigned   Forest Gleason, MD   06/19/2014 10:56 AM

## 2014-07-28 ENCOUNTER — Inpatient Hospital Stay (HOSPITAL_BASED_OUTPATIENT_CLINIC_OR_DEPARTMENT_OTHER): Payer: Medicare Other | Admitting: Oncology

## 2014-07-28 ENCOUNTER — Telehealth: Payer: Self-pay | Admitting: *Deleted

## 2014-07-28 ENCOUNTER — Inpatient Hospital Stay: Payer: Medicare Other | Attending: Oncology

## 2014-07-28 VITALS — BP 105/70 | HR 91 | Temp 98.8°F | Wt 117.3 lb

## 2014-07-28 DIAGNOSIS — C3411 Malignant neoplasm of upper lobe, right bronchus or lung: Secondary | ICD-10-CM

## 2014-07-28 DIAGNOSIS — M899 Disorder of bone, unspecified: Secondary | ICD-10-CM | POA: Insufficient documentation

## 2014-07-28 DIAGNOSIS — Z915 Personal history of self-harm: Secondary | ICD-10-CM | POA: Diagnosis not present

## 2014-07-28 DIAGNOSIS — E780 Pure hypercholesterolemia: Secondary | ICD-10-CM | POA: Diagnosis not present

## 2014-07-28 DIAGNOSIS — F209 Schizophrenia, unspecified: Secondary | ICD-10-CM

## 2014-07-28 DIAGNOSIS — Z9221 Personal history of antineoplastic chemotherapy: Secondary | ICD-10-CM | POA: Diagnosis not present

## 2014-07-28 DIAGNOSIS — Z85118 Personal history of other malignant neoplasm of bronchus and lung: Secondary | ICD-10-CM | POA: Insufficient documentation

## 2014-07-28 DIAGNOSIS — R05 Cough: Secondary | ICD-10-CM | POA: Diagnosis not present

## 2014-07-28 DIAGNOSIS — Z923 Personal history of irradiation: Secondary | ICD-10-CM

## 2014-07-28 DIAGNOSIS — M25511 Pain in right shoulder: Secondary | ICD-10-CM | POA: Diagnosis not present

## 2014-07-28 DIAGNOSIS — R0602 Shortness of breath: Secondary | ICD-10-CM

## 2014-07-28 DIAGNOSIS — Z9049 Acquired absence of other specified parts of digestive tract: Secondary | ICD-10-CM | POA: Diagnosis not present

## 2014-07-28 DIAGNOSIS — K219 Gastro-esophageal reflux disease without esophagitis: Secondary | ICD-10-CM | POA: Diagnosis not present

## 2014-07-28 DIAGNOSIS — Z8781 Personal history of (healed) traumatic fracture: Secondary | ICD-10-CM

## 2014-07-28 DIAGNOSIS — D509 Iron deficiency anemia, unspecified: Secondary | ICD-10-CM | POA: Diagnosis not present

## 2014-07-28 DIAGNOSIS — F17210 Nicotine dependence, cigarettes, uncomplicated: Secondary | ICD-10-CM | POA: Diagnosis not present

## 2014-07-28 DIAGNOSIS — E119 Type 2 diabetes mellitus without complications: Secondary | ICD-10-CM | POA: Diagnosis not present

## 2014-07-28 DIAGNOSIS — Z79899 Other long term (current) drug therapy: Secondary | ICD-10-CM | POA: Insufficient documentation

## 2014-07-28 DIAGNOSIS — C3490 Malignant neoplasm of unspecified part of unspecified bronchus or lung: Secondary | ICD-10-CM

## 2014-07-28 LAB — CBC WITH DIFFERENTIAL/PLATELET
BASOS ABS: 0 10*3/uL (ref 0–0.1)
Basophils Relative: 1 %
EOS PCT: 3 %
Eosinophils Absolute: 0.2 10*3/uL (ref 0–0.7)
HCT: 29.2 % — ABNORMAL LOW (ref 35.0–47.0)
Hemoglobin: 9.6 g/dL — ABNORMAL LOW (ref 12.0–16.0)
LYMPHS ABS: 0.4 10*3/uL — AB (ref 1.0–3.6)
Lymphocytes Relative: 8 %
MCH: 29.1 pg (ref 26.0–34.0)
MCHC: 32.8 g/dL (ref 32.0–36.0)
MCV: 88.6 fL (ref 80.0–100.0)
MONO ABS: 0.6 10*3/uL (ref 0.2–0.9)
Monocytes Relative: 11 %
Neutro Abs: 4.4 10*3/uL (ref 1.4–6.5)
Neutrophils Relative %: 77 %
PLATELETS: 296 10*3/uL (ref 150–440)
RBC: 3.29 MIL/uL — AB (ref 3.80–5.20)
RDW: 16.3 % — ABNORMAL HIGH (ref 11.5–14.5)
WBC: 5.7 10*3/uL (ref 3.6–11.0)

## 2014-07-28 LAB — COMPREHENSIVE METABOLIC PANEL
ALT: 10 U/L — AB (ref 14–54)
ANION GAP: 8 (ref 5–15)
AST: 14 U/L — ABNORMAL LOW (ref 15–41)
Albumin: 4.1 g/dL (ref 3.5–5.0)
Alkaline Phosphatase: 103 U/L (ref 38–126)
BUN: 14 mg/dL (ref 6–20)
CALCIUM: 9 mg/dL (ref 8.9–10.3)
CO2: 24 mmol/L (ref 22–32)
CREATININE: 0.93 mg/dL (ref 0.44–1.00)
Chloride: 96 mmol/L — ABNORMAL LOW (ref 101–111)
GLUCOSE: 113 mg/dL — AB (ref 65–99)
Potassium: 5 mmol/L (ref 3.5–5.1)
Sodium: 128 mmol/L — ABNORMAL LOW (ref 135–145)
Total Bilirubin: 0.6 mg/dL (ref 0.3–1.2)
Total Protein: 6.6 g/dL (ref 6.5–8.1)

## 2014-07-28 IMAGING — CR DG CHEST 2V
1 series · 2 of 2 positions shown · non-contrast
Comparison: Chest x-rays dated 02/10/2013 and 12/22/2012

CLINICAL DATA: Worsening hyponatremia.  Lung cancer.

EXAM:
CHEST  2 VIEW

[Series 1: w chest pa · 0.14mm/px · 2 of 2 slices shown]
[im 1/2]
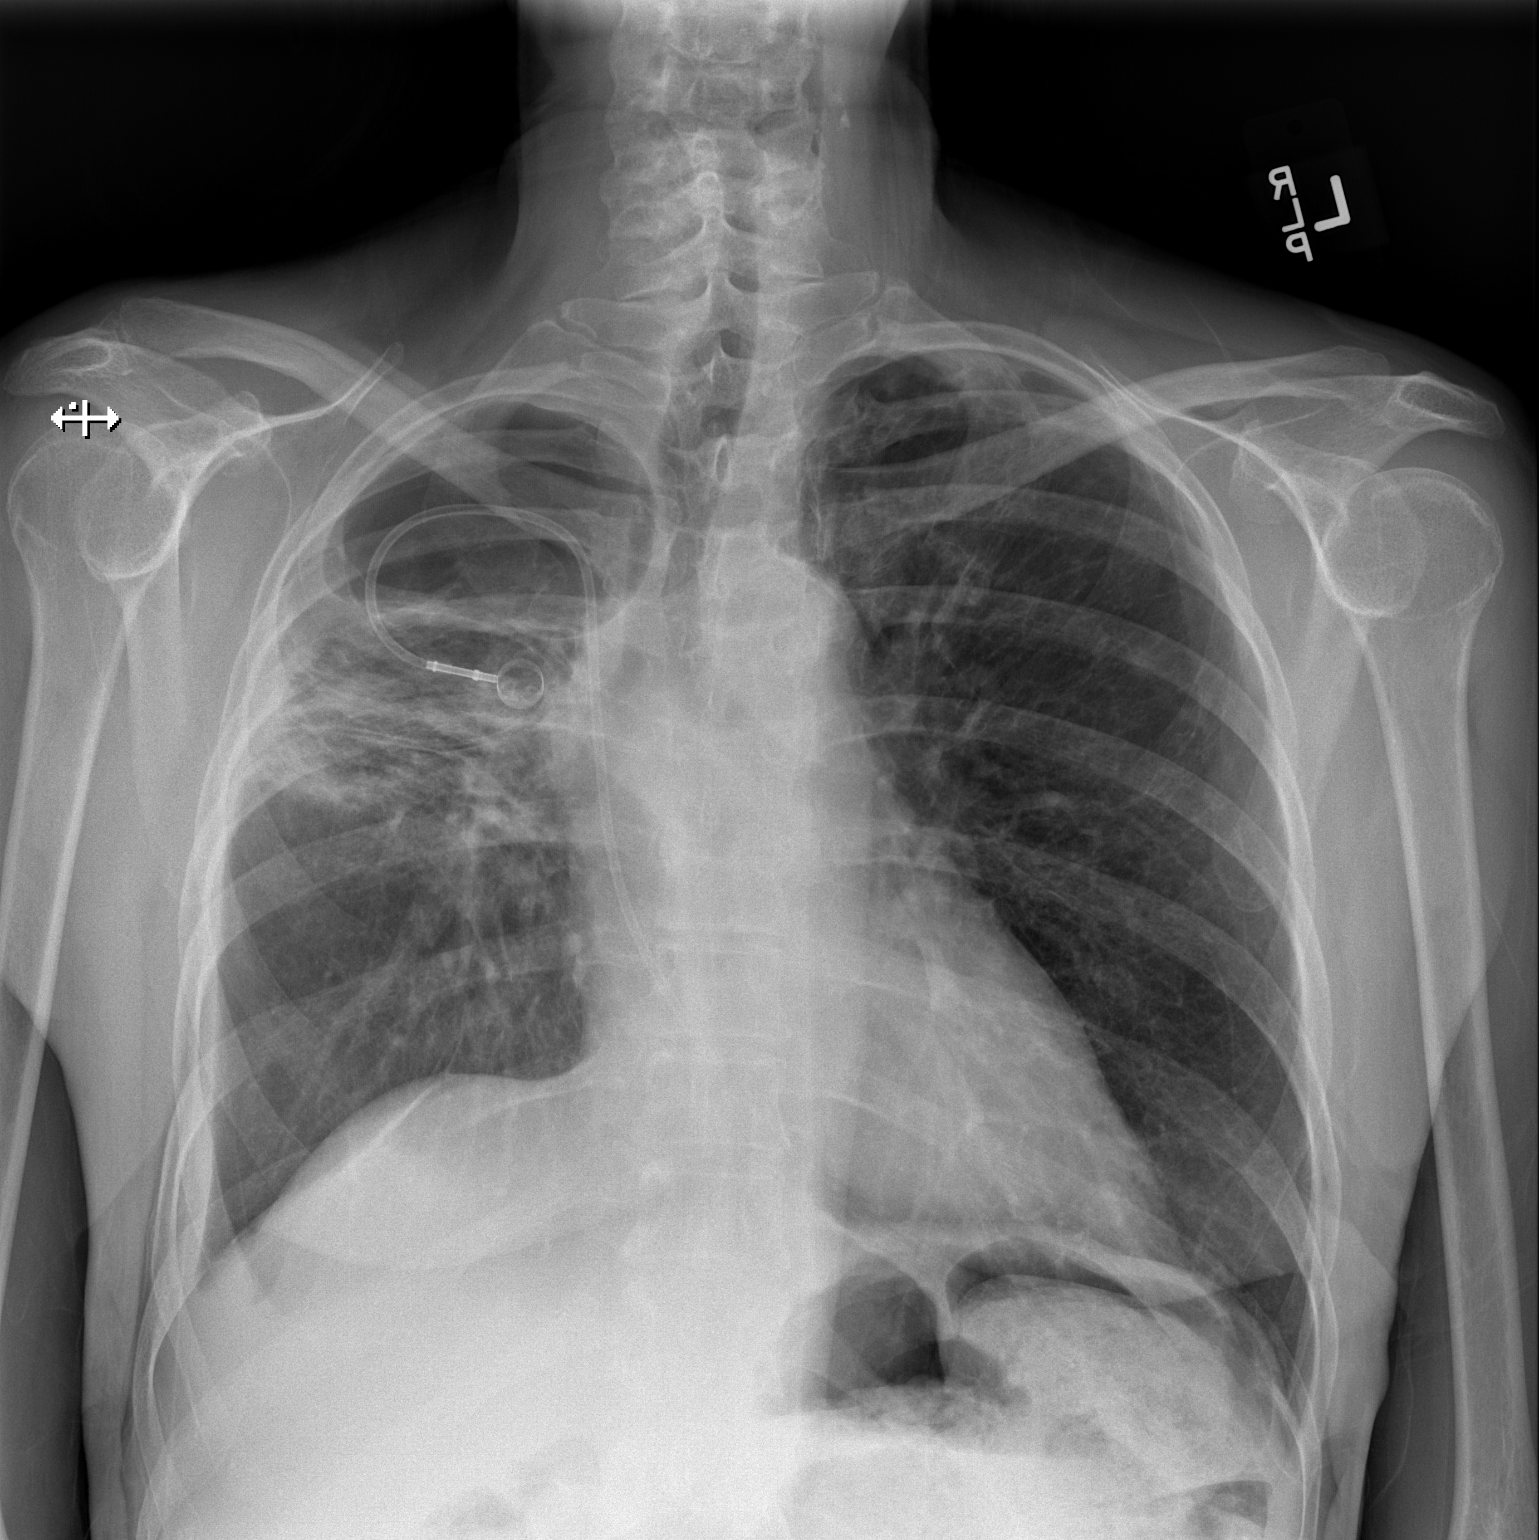
[im 2/2]
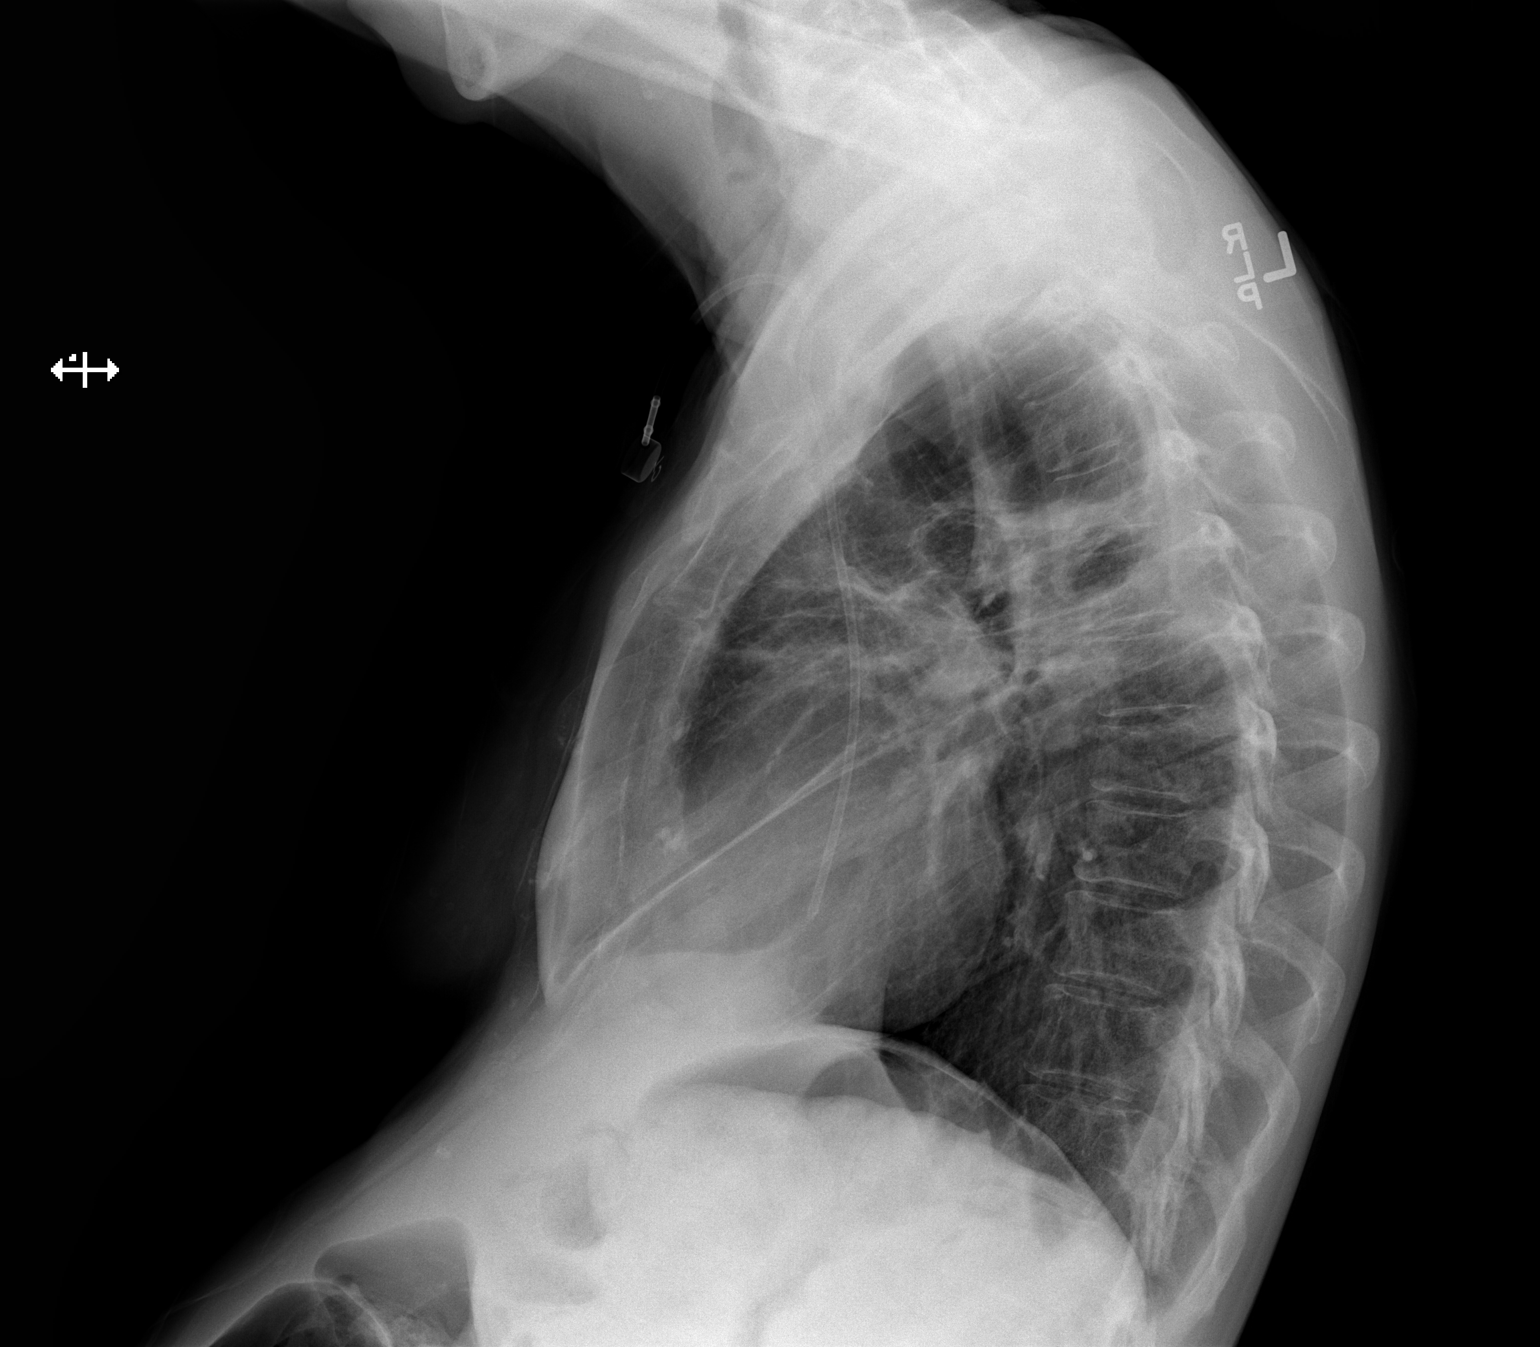

[2 of 2 positions shown; findings below may reference images not displayed]

FINDINGS: Port-A-Cath in place, unchanged. Heart size and pulmonary
vascularity are normal.

Again noted are emphysematous changes bilaterally with slight
scarring in the left upper lobe and prominent scarring in the right
midzone as well as a prominent bleb in the right lung apex. Overall
density in the right midzone is slightly increased inferiorly which
I suspect represents progressive scarring.

There is chronic blunting of the right costophrenic angle, probably
due to small loculated effusion. No acute osseous abnormality.
IMPRESSION: Slight increased scarring in the right midzone.  Severe emphysema.

## 2014-07-28 NOTE — Progress Notes (Signed)
Patient does not have living will.  Former smoker. 

## 2014-07-29 NOTE — Telephone Encounter (Signed)
Will need to talk with interventional radiologist to arrange biopsy. Will call on Monday to discuss with IR.

## 2014-07-30 ENCOUNTER — Other Ambulatory Visit: Payer: Self-pay | Admitting: Oncology

## 2014-08-02 ENCOUNTER — Ambulatory Visit: Payer: Medicare Other

## 2014-08-02 NOTE — Telephone Encounter (Signed)
Will arrange for repeat MRI of right shoulder to evaluate if biopsy is possible.

## 2014-08-05 ENCOUNTER — Ambulatory Visit
Admission: RE | Admit: 2014-08-05 | Discharge: 2014-08-05 | Disposition: A | Discharge status: Home / Self Care | Payer: Medicare Other | Source: Ambulatory Visit | Attending: Oncology | Admitting: Oncology

## 2014-08-05 DIAGNOSIS — M25811 Other specified joint disorders, right shoulder: Secondary | ICD-10-CM | POA: Insufficient documentation

## 2014-08-05 DIAGNOSIS — S42291K Other displaced fracture of upper end of right humerus, subsequent encounter for fracture with nonunion: Secondary | ICD-10-CM | POA: Insufficient documentation

## 2014-08-05 DIAGNOSIS — M25511 Pain in right shoulder: Secondary | ICD-10-CM | POA: Diagnosis present

## 2014-08-05 MED ORDER — GADOBENATE DIMEGLUMINE 529 MG/ML IV SOLN
10.0000 mL | Freq: Once | INTRAVENOUS | Status: AC | PRN
  Administered 2014-08-05: 10 mL via INTRAVENOUS

## 2014-08-05 NOTE — Telephone Encounter (Signed)
Pt's daughter made aware that pt needs second opinion from orthopedic surgeon due to poss osteomyelitis seen on MRI today.

## 2014-08-10 ENCOUNTER — Encounter: Payer: Self-pay | Admitting: Oncology

## 2014-08-10 NOTE — Progress Notes (Signed)
[place] @ [facility] Telephone:(336) [phone]  Fax:(336) [place]: 1955-09-06  MR#: [id number]  JYN#:[id number]  Patient Care Team: Jodi Marble, MD as PCP - General (Internal Medicine)  CHIEF COMPLAINT:  Chief Complaint  Patient presents with  . Follow-up    Oncology History   1. Abnormal CT scan.  Recently found during hospitalization(tired January, 2013) 2. 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 Start on concurrent radiation and chemotherapy with cis-platinum and VP-16 from March 07, 2011(chemotherapy was delayed to February of 2013) 4. Radiation has been finished in May of 2013 5. Patient has finished 6 cycles of chemotherapy with cis platinum and VP-16 (July of 2013     Primary cancer of right upper lobe of lung   06/19/2014 Initial Diagnosis Primary cancer of right upper lobe of lung    No flowsheet data found.  INTERVAL HISTORY: 59 year old lady ordered a history of small cell undifferentiated carcinoma of lung status post 6 cycles of chemotherapy had a recent fall had broken right humerus.  An MRI scan done at [facility] which revealed the communicated fracture of the right humerus.  There is abnormal angulation of the distal humeral shaft.  Possibility of found infection like osteomyelitis versus metastatic disease cannot be ruled out H and is here for ongoing evaluation and treatment consideration July 28, 2014 Patient is here for ongoing evaluation regarding small cell undifferentiated carcinoma of lung.  Patient continues to have pain in the right shoulder.  Orthopedic surgeon has not evaluated her again stating that she needs biopsy of the right shoulder.  To rule out malignancy. No cough no shortness of breath no  fever no chills.  REVIEW OF SYSTEMS:   Gen. status: Patient is not any acute distress.  HEENT: No evidence of soreness in the mouth.  No difficulty swallowing.  Musculoskeletal system holding right shoulder.  Does not voice complain of pain in the right shoulder but.  Lungs: Dry hacking cough shortness of breath on exertion cardiac: No chest pain.  GI: No nausea.  No vomiting.  No diarrhea.  Rash.  Neurological system: No headache or dizziness or weakness.  No tingling numbness in lower extremity.  Endocrine system no complaints lower extremity no swelling Increasing discomfort and pain in the right shoulder  As per HPI. Otherwise, a complete review of systems is negatve.  PAST MEDICAL HISTORY:  been reviewed from previous medical records  PAST SURGICAL HISTORY: PFSH: Comments: No family history of colorectal cancer, breast cancer, or ovarian cancer.   Marland Kitchen  History of cerebrovascular accident hypertension in the family  Social History: positive tobacco  Smoker: Smoking cessation counseling performed  Comments: patient lives in a group home.  Divorced.  .  Chronic smoker started smoking since  age of 80.  Does not drink.  Additional Past Medical and Surgical History: history of schizophrenia  and suicidal  attempts    Gastroesophageal reflux disease   Iron deficiency anemia   Hypocholesteremia   Diabetes controlled with oral medication   History of cholecystectomy    GYNECOLOGIC HISTORY:  No LMP recorded.     ADVANCED DIRECTIVES:  Patient does have advanced medical directive  HEALTH MAINTENANCE: History  Substance Use Topics  . Smoking status: Former  Smoker    Quit date: 06/18/2013  . Smokeless tobacco: Not on file  . Alcohol Use: Not on file    :  Allergies  Allergen Reactions  . Latex Itching  . Olanzapine Hives  . Risperidone Hives    Current Outpatient Prescriptions  Medication Sig Dispense Refill  . amLODipine-benazepril (LOTREL) 5-20 MG per capsule     .  atorvastatin (LIPITOR) 10 MG tablet Take by mouth.    . benztropine (COGENTIN) 2 MG tablet Take by mouth.    Marland Kitchen buPROPion (WELLBUTRIN XL) 150 MG 24 hr tablet Take by mouth.    . carvedilol (COREG) 25 MG tablet Take by mouth.    . Cholecalciferol 1000 UNITS tablet Take by mouth.    . donepezil (ARICEPT) 10 MG tablet     . haloperidol (HALDOL) 10 MG tablet     . haloperidol decanoate (HALDOL DECANOATE) 100 MG/ML injection     . LORazepam (ATIVAN) 0.5 MG tablet Take by mouth.    . lubiprostone (AMITIZA) 24 MCG capsule     . magnesium oxide (MAG-OX) 400 MG tablet Take by mouth.    . metFORMIN (GLUCOPHAGE) 1000 MG tablet Take by mouth.    . metFORMIN (GLUCOPHAGE) 850 MG tablet Take by mouth.    Marland Kitchen omeprazole (PRILOSEC) 20 MG capsule Take by mouth.    . polyethylene glycol powder (GLYCOLAX/MIRALAX) powder     . Tiotropium Bromide Monohydrate 2.5 MCG/ACT AERS     . ziprasidone (GEODON) 20 MG capsule     . ziprasidone (GEODON) 80 MG capsule Take by mouth.    . lactulose (CHRONULAC) 10 GM/15ML solution TAKE ONE TABLESPOONFUL BY MOUTH THREE TIMES A DAY AS NEEDED FOR CONSTIPATION 450 mL 0   No current facility-administered medications for this visit.    OBJECTIVE:  Filed Vitals:   07/28/14 1207  BP: 105/70  Pulse: 91  Temp: 98.8 F (37.1 C)     There is no height on file to calculate BMI.    ECOG FS:1 - Symptomatic but completely ambulatory  PHYSICAL EXAM: GENERAL:  Well developed, well nourished, sitting comfortably in the exam room in no acute distress. MENTAL STATUS:  Alert and oriented to person, place and time. HEAD:  Normocephalic, atraumatic, face symmetric, no Cushingoid features. EYES:  .  Pupils equal round and reactive to light and accomodation.  No conjunctivitis or scleral icterus. ENT:  Oropharynx clear without lesion.  Tongue normal. Mucous membranes moist.  RESPIRATORY:  Clear to auscultation without rales, wheezes or rhonchi. CARDIOVASCULAR:  Regular rate and rhythm  without murmur, rub or gallop. . ABDOMEN:  Soft, non-tender, with active bowel sounds, and no hepatosplenomegaly.  No masses. BACK:  No CVA tenderness.  No tenderness on percussion of the back or rib cage. SKIN:  No rashes, ulcers or lesions. EXTREMITIES: Right upper extremity.  Tenderness in the right shoulder.  Patient is guarding right upper extremity. LYMPH NODES: No palpable cervical, supraclavicular, axillary or inguinal adenopathy  NEUROLOGICAL: Unremarkable. PSYCH:  Appropriate.   LAB RESULTS:  Appointment on 07/28/2014  Component Date Value Ref Range Status  . WBC 07/28/2014 5.7  3.6 - 11.0 K/uL Final  . RBC 07/28/2014 3.29* 3.80 - 5.20 MIL/uL Final  . Hemoglobin 07/28/2014 9.6* 12.0 - 16.0 g/dL Final  . HCT 07/28/2014 29.2* 35.0 - 47.0 % Final  . MCV 07/28/2014 88.6  80.0 - 100.0 fL Final  . MCH 07/28/2014 29.1  26.0 - 34.0 pg Final  . MCHC 07/28/2014 32.8  32.0 -  36.0 g/dL Final  . RDW 07/28/2014 16.3* 11.5 - 14.5 % Final  . Platelets 07/28/2014 296  150 - 440 K/uL Final  . Neutrophils Relative % 07/28/2014 77   Final  . Neutro Abs 07/28/2014 4.4  1.4 - 6.5 K/uL Final  . Lymphocytes Relative 07/28/2014 8   Final  . Lymphs Abs 07/28/2014 0.4* 1.0 - 3.6 K/uL Final  . Monocytes Relative 07/28/2014 11   Final  . Monocytes Absolute 07/28/2014 0.6  0.2 - 0.9 K/uL Final  . Eosinophils Relative 07/28/2014 3   Final  . Eosinophils Absolute 07/28/2014 0.2  0 - 0.7 K/uL Final  . Basophils Relative 07/28/2014 1   Final  . Basophils Absolute 07/28/2014 0.0  0 - 0.1 K/uL Final  . Sodium 07/28/2014 128* 135 - 145 mmol/L Final  . Potassium 07/28/2014 5.0  3.5 - 5.1 mmol/L Final  . Chloride 07/28/2014 96* 101 - 111 mmol/L Final  . CO2 07/28/2014 24  22 - 32 mmol/L Final  . Glucose, Bld 07/28/2014 113* 65 - 99 mg/dL Final  . BUN 07/28/2014 14  6 - 20 mg/dL Final  . Creatinine, Ser 07/28/2014 0.93  0.44 - 1.00 mg/dL Final  . Calcium 07/28/2014 9.0  8.9 - 10.3 mg/dL Final  . Total  Protein 07/28/2014 6.6  6.5 - 8.1 g/dL Final  . Albumin 07/28/2014 4.1  3.5 - 5.0 g/dL Final  . AST 07/28/2014 14* 15 - 41 U/L Final  . ALT 07/28/2014 10* 14 - 54 U/L Final  . Alkaline Phosphatase 07/28/2014 103  38 - 126 U/L Final  . Total Bilirubin 07/28/2014 0.6  0.3 - 1.2 mg/dL Final  . GFR calc non Af Amer 07/28/2014 >60  >60 mL/min Final  . GFR calc Af Amer 07/28/2014 >60  >60 mL/min Final   Comment: (NOTE) The eGFR has been calculated using the CKD EPI equation. This calculation has not been validated in all clinical situations. eGFR's persistently <60 mL/min signify possible Chronic Kidney Disease.   . Anion gap 07/28/2014 8  5 - 15 Final      STUDIES: MRI scan of the right shoulder done at [facility] has been reviewed independently and we will upload MRI scan on our system.  There is significant changes in the right proximal head and distal staff.  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  Has been reviewed.  Independently.  Shows thickening of the right upper lobe area.  Abnormality of right humerus Repeat MRI scan of the right shoulder  MEDICAL DECISION MAKING:  Carcinoma of lung there is no evidence of recurrent disease based on clinical examination and loss CT scan.  MRI scan of right shoulder was repeated.  I reviewed those MRI scan also discussed situation with radiologist's.  Who suggested that patient definitely has osteomyelitis abscess and he does not think there is not any evidence of malignancy.  According to radiologist patient needs immediate orthopedic evaluation.  I discussed that with the patient's daughter that patient needs to be seen by orthopedic surgeon right away to rule out osteomyelitis and abscess.  There are no clinical signs of  fever or leukocytosis  I am not sure who his old region orthopedic surgeon who had evaluated patient.  Patient's family did not remember the name but they  have window look into the records and contact orthopedic surgeon. I discussed other option would be interventional radiologist aspirating this area but I would like to have orthopedic surgeon evaluate this.  [facility] needed [facility] Total duration of visit was50 minutes.  50% or more time was spent in counseling patient and family regarding prognosis and options of treatment and available resources Patient expressed understanding and was in agreement with this plan. She also understands that She can call clinic at any time with any questions, concerns, or complaints.    Primary cancer of right upper lobe of lung   Staging form: Lung, AJCC 7th Edition     Clinical: Stage IIIA (T3, N2, M0) - Unsigned   Forest Gleason, MD   08/10/2014 9:33 AM

## 2014-08-12 ENCOUNTER — Other Ambulatory Visit: Payer: Self-pay | Admitting: Oncology

## 2014-09-22 ENCOUNTER — Other Ambulatory Visit: Payer: Self-pay | Admitting: Oncology

## 2014-11-08 ENCOUNTER — Other Ambulatory Visit: Payer: Self-pay | Admitting: Internal Medicine

## 2014-11-08 DIAGNOSIS — Z1231 Encounter for screening mammogram for malignant neoplasm of breast: Secondary | ICD-10-CM

## 2014-11-16 ENCOUNTER — Ambulatory Visit: Payer: Medicare Other

## 2014-11-16 ENCOUNTER — Ambulatory Visit: Admission: RE | Admit: 2014-11-16 | Payer: Medicare Other | Source: Ambulatory Visit

## 2014-11-17 ENCOUNTER — Ambulatory Visit: Payer: Medicare Other

## 2014-11-23 ENCOUNTER — Inpatient Hospital Stay: Payer: Medicare Other

## 2014-11-23 ENCOUNTER — Inpatient Hospital Stay: Payer: Medicare Other | Admitting: Oncology

## 2014-12-07 DEATH — deceased
